# Patient Record
Sex: Female | Born: 1975 | Race: White | Hispanic: No | Marital: Married | Smoking: Never smoker
Health system: Southern US, Community
[De-identification: ages and names within clinical notes are randomized; demographics above are authoritative.]

## PROBLEM LIST (undated history)

## (undated) DIAGNOSIS — T7840XA Allergy, unspecified, initial encounter: Secondary | ICD-10-CM

## (undated) DIAGNOSIS — D649 Anemia, unspecified: Secondary | ICD-10-CM

## (undated) DIAGNOSIS — J45909 Unspecified asthma, uncomplicated: Secondary | ICD-10-CM

## (undated) DIAGNOSIS — H35042 Retinal micro-aneurysms, unspecified, left eye: Secondary | ICD-10-CM

## (undated) DIAGNOSIS — I10 Essential (primary) hypertension: Secondary | ICD-10-CM

## (undated) DIAGNOSIS — E282 Polycystic ovarian syndrome: Secondary | ICD-10-CM

## (undated) HISTORY — DX: Unspecified asthma, uncomplicated: J45.909

## (undated) HISTORY — PX: TUBAL LIGATION: SHX77

## (undated) HISTORY — PX: GALLBLADDER SURGERY: SHX652

## (undated) HISTORY — DX: Anemia, unspecified: D64.9

## (undated) HISTORY — DX: Retinal micro-aneurysms, unspecified, left eye: H35.042

## (undated) HISTORY — PX: CHOLECYSTECTOMY: SHX55

## (undated) HISTORY — DX: Essential (primary) hypertension: I10

## (undated) HISTORY — DX: Polycystic ovarian syndrome: E28.2

## (undated) HISTORY — PX: HERNIA REPAIR: SHX51

## (undated) HISTORY — DX: Allergy, unspecified, initial encounter: T78.40XA

---

## 2007-11-28 ENCOUNTER — Emergency Department (HOSPITAL_COMMUNITY): Admission: EM | Admit: 2007-11-28 | Discharge: 2007-11-28 | Payer: Self-pay | Admitting: Emergency Medicine

## 2008-02-03 ENCOUNTER — Emergency Department (HOSPITAL_COMMUNITY): Admission: EM | Admit: 2008-02-03 | Discharge: 2008-02-03 | Payer: Self-pay | Admitting: Emergency Medicine

## 2009-02-20 ENCOUNTER — Emergency Department (HOSPITAL_COMMUNITY): Admission: EM | Admit: 2009-02-20 | Discharge: 2009-02-20 | Payer: Self-pay | Admitting: Emergency Medicine

## 2011-08-18 DIAGNOSIS — H35042 Retinal micro-aneurysms, unspecified, left eye: Secondary | ICD-10-CM

## 2011-08-18 HISTORY — DX: Retinal micro-aneurysms, unspecified, left eye: H35.042

## 2011-08-19 ENCOUNTER — Ambulatory Visit (INDEPENDENT_AMBULATORY_CARE_PROVIDER_SITE_OTHER): Payer: BC Managed Care – PPO | Admitting: Emergency Medicine

## 2011-08-19 DIAGNOSIS — J329 Chronic sinusitis, unspecified: Secondary | ICD-10-CM

## 2011-08-19 DIAGNOSIS — E282 Polycystic ovarian syndrome: Secondary | ICD-10-CM

## 2011-08-19 DIAGNOSIS — I10 Essential (primary) hypertension: Secondary | ICD-10-CM

## 2011-08-19 DIAGNOSIS — E119 Type 2 diabetes mellitus without complications: Secondary | ICD-10-CM | POA: Insufficient documentation

## 2011-08-19 DIAGNOSIS — H35049 Retinal micro-aneurysms, unspecified, unspecified eye: Secondary | ICD-10-CM

## 2011-08-19 DIAGNOSIS — H35042 Retinal micro-aneurysms, unspecified, left eye: Secondary | ICD-10-CM

## 2011-08-19 LAB — COMPREHENSIVE METABOLIC PANEL
AST: 41 U/L — ABNORMAL HIGH (ref 0–37)
Alkaline Phosphatase: 99 U/L (ref 39–117)
BUN: 11 mg/dL (ref 6–23)
Calcium: 8.6 mg/dL (ref 8.4–10.5)
Creat: 0.58 mg/dL (ref 0.50–1.10)
Total Bilirubin: 0.3 mg/dL (ref 0.3–1.2)

## 2011-08-19 LAB — POCT CBC
Granulocyte percent: 68 %G (ref 37–80)
Hemoglobin: 11.7 g/dL — AB (ref 12.2–16.2)
MCV: 82.3 fL (ref 80–97)
MID (cbc): 0.5 (ref 0–0.9)
MPV: 8.3 fL (ref 0–99.8)
Platelet Count, POC: 313 10*3/uL (ref 142–424)
RBC: 4.62 M/uL (ref 4.04–5.48)

## 2011-08-19 LAB — LIPID PANEL
HDL: 59 mg/dL (ref >39)
LDL Cholesterol: 40 mg/dL (ref 0–99)
Total CHOL/HDL Ratio: 1.8 Ratio
Triglycerides: 36 mg/dL (ref <150)

## 2011-08-19 LAB — GLUCOSE, POCT (MANUAL RESULT ENTRY): POC Glucose: 95

## 2011-08-19 MED ORDER — LISINOPRIL 20 MG PO TABS: 20.0000 mg | ORAL_TABLET | Freq: Every day | ORAL | Status: DC

## 2011-08-19 MED ORDER — AZITHROMYCIN 250 MG PO TABS: ORAL_TABLET | ORAL | Status: AC

## 2011-08-19 MED ORDER — METFORMIN HCL ER 750 MG PO TB24: 750.0000 mg | ORAL_TABLET | Freq: Two times a day (BID) | ORAL | Status: DC

## 2011-08-19 MED ORDER — LISINOPRIL-HYDROCHLOROTHIAZIDE 20-12.5 MG PO TABS: 1.0000 | ORAL_TABLET | Freq: Every day | ORAL | Status: DC

## 2011-08-19 NOTE — Patient Instructions (Addendum)
I spoken with the ophthalmologist at [facility]  ophthalmology They advise she followup with the ophthalmologist first of the week. If she has sudden loss of vision she is to present to the emergency room for emergent evaluation. Sinusitis Sinuses are air pockets within the bones of your face. The growth of bacteria within a sinus leads to infection. The infection prevents the sinuses from draining. This infection is called sinusitis. SYMPTOMS   There will be different areas of pain depending on which sinuses have become infected.  The maxillary sinuses often produce pain beneath the eyes.     Frontal sinusitis may cause pain in the middle of the forehead and above the eyes.  Other problems (symptoms) include:  Toothaches.     Colored, pus-like (purulent) drainage from the nose.     Swelling, warmth, and tenderness over the sinus areas may be signs of infection.  TREATMENT   Sinusitis is most often determined by an exam.X-rays may be taken. If x-rays have been taken, make sure you obtain your results or find out how you are to obtain them. Your caregiver may give you medications (antibiotics). These are medications that will help kill the bacteria causing the infection. You may also be given a medication (decongestant) that helps to reduce sinus swelling.   HOME CARE INSTRUCTIONS    Only take over-the-counter or prescription medicines for pain, discomfort, or fever as directed by your caregiver.     Drink extra fluids. Fluids help thin the mucus so your sinuses can drain more easily.     Applying either moist heat or ice packs to the sinus areas may help relieve discomfort.     Use saline nasal sprays to help moisten your sinuses. The sprays can be found at your local drugstore.  SEEK IMMEDIATE MEDICAL CARE IF:  You have a fever.     You have increasing pain, severe headaches, or toothache.     You have nausea, vomiting, or drowsiness.     You develop unusual swelling around the  face or trouble seeing.  MAKE SURE YOU:    Understand these instructions.     Will watch your condition.     Will get help right away if you are not doing well or get worse.  Document Released: 06/26/2005 Document Revised: 03/08/2011 Document Reviewed: 01/23/2007 [facility] Patient Information 2012 [place], [place].

## 2011-08-19 NOTE — Progress Notes (Signed)
  Subjective:    Patient ID: April Duncan, female    DOB: 01/12/1976, 36 y.o.   MRN: [id number]  HPI patient presents with upper respiratory type. She's had no fever she does have a cough which has been productive of small amounts yellow phlegm. She denies running fever has no real flu symptoms or myalgia she does have facial pressure and discomfort.    Review of Systems patient states that she was seen yesterday by an optometrist. She is having discomfort with her vision in the left eye. Diagnosed with a retinal hemorrhage. She subsequently did be to be referred to a retinal specialist on Monday.     Objective:   Physical Exam  Constitutional: She is oriented to person, place, and time. She appears well-developed and well-nourished.  Eyes: Pupils are equal, round, and reactive to light.  Neck: Neck supple. No thyromegaly present.  Cardiovascular: Normal rate and regular rhythm.   Pulmonary/Chest: Effort normal and breath sounds normal.  Neurological: She is alert and oriented to person, place, and time.  Skin: Skin is warm and dry.   Examination of the left eye reveals a small retinal hemorrhage between the optic discs and macula. The margins are well-circumscribed the vessels leading to this area all appear normal.       Assessment & Plan:  Assessment is a left retinal hemorrhage with plans to see a retinal specialist the first of the week. Her problem on visit today is regarding a recent upper respiratory type infection with head congestion purulent nasal drainage and productive cough. She also has a history of diabetes and hypertension and needs followup of these 2  problems.

## 2011-08-25 ENCOUNTER — Other Ambulatory Visit: Payer: Self-pay | Admitting: Family Medicine

## 2011-09-27 ENCOUNTER — Other Ambulatory Visit: Payer: Self-pay | Admitting: Family Medicine

## 2011-11-03 ENCOUNTER — Ambulatory Visit (INDEPENDENT_AMBULATORY_CARE_PROVIDER_SITE_OTHER): Payer: BC Managed Care – PPO | Admitting: Emergency Medicine

## 2011-11-03 DIAGNOSIS — J45909 Unspecified asthma, uncomplicated: Secondary | ICD-10-CM

## 2011-11-03 DIAGNOSIS — E119 Type 2 diabetes mellitus without complications: Secondary | ICD-10-CM

## 2011-11-03 DIAGNOSIS — E232 Diabetes insipidus: Secondary | ICD-10-CM

## 2011-11-03 LAB — POCT GLYCOSYLATED HEMOGLOBIN (HGB A1C): Hemoglobin A1C: 6.2

## 2011-11-03 MED ORDER — ALBUTEROL SULFATE (2.5 MG/3ML) 0.083% IN NEBU
2.5000 mg | INHALATION_SOLUTION | Freq: Once | RESPIRATORY_TRACT | Status: AC
  Administered 2011-11-03: 2.5 mg via RESPIRATORY_TRACT

## 2011-11-03 MED ORDER — ALBUTEROL SULFATE HFA 108 (90 BASE) MCG/ACT IN AERS: 2.0000 | INHALATION_SPRAY | Freq: Four times a day (QID) | RESPIRATORY_TRACT | Status: DC | PRN

## 2011-11-03 MED ORDER — BECLOMETHASONE DIPROPIONATE 80 MCG/ACT IN AERS: 2.0000 | INHALATION_SPRAY | Freq: Two times a day (BID) | RESPIRATORY_TRACT | Status: DC

## 2011-11-03 NOTE — Progress Notes (Signed)
  Subjective:    Patient ID: April Duncan, female    DOB: Nov 04, 1975, 36 y.o.   MRN: [id number]  HPI enters for followup of diabetes mellitus. She also is having a significant flare of her asthma she normally is on Qvar and albuterol but has run out of these medications. She has been checking her sugars at home they've been variable running between the 103 100. She continues on her metformin    Review of Systems this is her typical time he did have trouble with her asthma. She's currently using her nasal inhalers patent anus and Nasonex but has run out of her to go and albuterol.     Objective:   Physical Exam  Constitutional: She appears well-developed and well-nourished.  HENT:  Head: Normocephalic.  Eyes: Pupils are equal, round, and reactive to light.  Neck: No JVD present. No tracheal deviation present. No thyromegaly present.  Cardiovascular: Normal rate, regular rhythm and normal heart sounds.   Pulmonary/Chest: No respiratory distress. She has no wheezes. She has no rales. She exhibits no tenderness.  Abdominal: She exhibits no distension. There is no tenderness. There is no rebound and no guarding.  Musculoskeletal: Normal range of motion.  Lymphadenopathy:    She has no cervical adenopathy.    Results for orders placed in visit on 11/03/11  GLUCOSE, POCT (MANUAL RESULT ENTRY)      Component Value Range   POC Glucose 118    POCT GLYCOSYLATED HEMOGLOBIN (HGB A1C)      Component Value Range   Hemoglobin A1C 6.2           Assessment & Plan:  Diabetes appears under pretty good control with hemoglobin A1c of 6.2. We'll go ahead and refill her Qvar and Ventolin.

## 2011-11-04 ENCOUNTER — Emergency Department (HOSPITAL_COMMUNITY): Payer: BC Managed Care – PPO

## 2011-11-04 ENCOUNTER — Emergency Department (HOSPITAL_COMMUNITY)
Admission: EM | Admit: 2011-11-04 | Discharge: 2011-11-04 | Disposition: A | Discharge status: Home / Self Care | Payer: BC Managed Care – PPO | Attending: Emergency Medicine | Admitting: Emergency Medicine

## 2011-11-04 ENCOUNTER — Encounter (HOSPITAL_COMMUNITY): Payer: Self-pay | Admitting: Emergency Medicine

## 2011-11-04 DIAGNOSIS — J3489 Other specified disorders of nose and nasal sinuses: Secondary | ICD-10-CM | POA: Insufficient documentation

## 2011-11-04 DIAGNOSIS — E119 Type 2 diabetes mellitus without complications: Secondary | ICD-10-CM | POA: Insufficient documentation

## 2011-11-04 DIAGNOSIS — R0602 Shortness of breath: Secondary | ICD-10-CM | POA: Insufficient documentation

## 2011-11-04 DIAGNOSIS — I10 Essential (primary) hypertension: Secondary | ICD-10-CM | POA: Insufficient documentation

## 2011-11-04 DIAGNOSIS — R05 Cough: Secondary | ICD-10-CM | POA: Insufficient documentation

## 2011-11-04 DIAGNOSIS — J45909 Unspecified asthma, uncomplicated: Secondary | ICD-10-CM | POA: Insufficient documentation

## 2011-11-04 DIAGNOSIS — Z79899 Other long term (current) drug therapy: Secondary | ICD-10-CM | POA: Insufficient documentation

## 2011-11-04 DIAGNOSIS — R059 Cough, unspecified: Secondary | ICD-10-CM | POA: Insufficient documentation

## 2011-11-04 LAB — POCT I-STAT, CHEM 8
Chloride: 106 mEq/L (ref 96–112)
Creatinine, Ser: 0.8 mg/dL (ref 0.50–1.10)
Glucose, Bld: 99 mg/dL (ref 70–99)
Potassium: 3.7 mEq/L (ref 3.5–5.1)

## 2011-11-04 LAB — URINALYSIS, ROUTINE W REFLEX MICROSCOPIC
Bilirubin Urine: NEGATIVE
Glucose, UA: NEGATIVE mg/dL
Hgb urine dipstick: NEGATIVE
Ketones, ur: NEGATIVE mg/dL
Nitrite: NEGATIVE
Protein, ur: NEGATIVE mg/dL
Specific Gravity, Urine: 1.012 (ref 1.005–1.030)
Urobilinogen, UA: 0.2 mg/dL (ref 0.0–1.0)
pH: 6.5 (ref 5.0–8.0)

## 2011-11-04 LAB — URINE MICROSCOPIC-ADD ON

## 2011-11-04 MED ORDER — TECHNETIUM TO 99M ALBUMIN AGGREGATED
3.3000 | Freq: Once | INTRAVENOUS | Status: AC | PRN
  Administered 2011-11-04: 3.3 via INTRAVENOUS

## 2011-11-04 MED ORDER — IOHEXOL 300 MG/ML  SOLN
100.0000 mL | Freq: Once | INTRAMUSCULAR | Status: AC | PRN
  Administered 2011-11-04: 100 mL via INTRAVENOUS

## 2011-11-04 NOTE — Discharge Instructions (Signed)
Please read and follow all provided instructions.  Your diagnoses today include:  1. Shortness of breath     Tests performed today include:  CT chest that could not tell if you had blood clots in your lungs  VQ scan that shows you do not have blood clots in your lungs  Vital signs. See below for your results today.   Medications prescribed:   None  Home care instructions:  Follow any educational materials contained in this packet.  Continue to use your albuterol inhaler for shortness of breath as prescribed by your doctor.   Follow-up instructions: Please follow-up with your primary care provider in the next 3 days for further evaluation of your symptoms. If you do not have a primary care doctor -- see below for referral information.   Return instructions:   Please return to the Emergency Department if you experience worsening symptoms.  Please return with worsening wheezing, shortness of breath, or difficulty breathing.  Return with persistent fever above 101F.   Please return if you have any other emergent concerns.  Additional Information:  Your vital signs today were: BP 130/69  Pulse 81  Temp(Src) 97.9 F (36.6 C) (Oral)  Resp 16  SpO2 97%  LMP 10/21/2011 If your blood pressure (BP) was elevated above 135/85 this visit, please have this repeated by your doctor within one month. -------------- No Primary Care Doctor Call [facility]  [phone] Other agencies that provide inexpensive medical care    [facility]  [phone]    [facility]  [phone]    [facility]  [phone]    [facility]  [phone]    [facility]  [phone]    [facility]  [phone] -------------- RESOURCE GUIDE:  Dental Problems  Patients with Medicaid: [facility] [phone] [address]                                            [phone] [address] [employer] [address] Phone:  [phone]                                                    Phone:  [phone]  If unable to pay or uninsured, contact:  [facility] or [facility]. to become qualified for the adult dental clinic.  Chronic Pain Problems Contact [facility]  [phone] Patients need to be referred by their primary care doctor.  Insufficient Money for Medicine Contact [employer]:  call "211" or [facility] [phone].  Psychological Services [facility]  [phone] [facility]  [phone] [facility] Mental [facility]   [phone] (emergency services [phone])  Substance Abuse Resources [facility]  9168723166 [facility] [phone] [address] [place] [phone] Floydene Flock [phone] Residential & Outpatient Substance Abuse Program  [phone]  Abuse/Neglect [facility] Child Abuse Hotline (216)068-4702 [facility] Child Abuse Hotline 520-037-2128 (After Hours)  Emergency Shelter [facility] [phone]  Maternity Homes Room at the [place] [facility] [place] 6412640784 [facility] Services [phone]  [place]  [employer]  [facility]     [employer]                          [facility] Dept. [address] [place]                       [address]      [address]  Blondell Reveal Phone:  [phone]                                   Phone:  [phone]                 Phone:  [phone]  [facility] Phone:  [phone]  [facility] Child Abuse Hotline [phone] (After Hours)

## 2011-11-04 NOTE — ED Notes (Signed)
Patient is alert and oriented x3.  She was given DC instructions and follow up visit instructions.  Patient gave verbal understanding. She was DC ambulatory under his own power to home.  V/S stable.  He was not showing any signs of distress on DC 

## 2011-11-04 NOTE — ED Notes (Signed)
EKG Performed on night shift at 1:01 am 11/04/11.

## 2011-11-04 NOTE — ED Notes (Signed)
Pt co Shortness of breath beginning yesterday, seen at PCP yesterday and given a neb tx and ordered 2 inhalers (not picked up yet), pt awaken 15 min ago with shortness of breath, cough with thick yellow mucus, pt also states she has some chest tightness and nausea.

## 2011-11-04 NOTE — ED Provider Notes (Signed)
Medical screening examination/treatment/procedure(s) were conducted as a shared visit with non-physician practitioner(s) and myself.  I personally evaluated the patient during the encounter  Please see my separate respective documentation pertaining to this patient encounter   Vida Roller, MD 11/04/11 [phone]

## 2011-11-04 NOTE — ED Provider Notes (Signed)
History     CSN: [id number]  Arrival date & time 11/04/11  0041   First MD Initiated Contact with Patient 11/04/11 0053      Chief Complaint  Patient presents with  . Asthma     Patient is a 36 y.o. female presenting with shortness of breath. The history is provided by the patient.  Shortness of Breath  The current episode started more than 1 week ago. The onset was sudden. The problem occurs frequently. The problem has been gradually worsening. The problem is moderate. The symptoms are relieved by beta-agonist inhalers. The symptoms are aggravated by activity and a supine position. Associated symptoms include rhinorrhea, cough and shortness of breath. Pertinent negatives include no chest pain, no chest pressure, no fever, no sore throat and no wheezing. Her past medical history is significant for asthma. She has been behaving normally.  Pt reports increasing SOB over the last 2 weeks. States symptoms began on a return trip to [place] approx 2 weeks ago (by car). States it strted as mild cold symptoms that worsened and persisted in spite of multiple OTC and Rx products she has used to palliate symptoms.  States she has been using her inhalers more frequently w/ only brief relief. States she becomes very SOB w/ activity. Admits to increased productive cough but no fever. Denies CP but states her chest is "sore" from all the coughing and SOB. Went to [facility] this past evening and was given a breathing tx which helped some. She attempted to lie down for bed and became acutely SOB and felt as if she could not breathe. Denies recent pain, redness or swelling in either LE or any past hx of DVT. No family hx of DVT.   Past Medical History  Diagnosis Date  . 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 Diabetes mellitus   . Hypertension   . PCOS (polycystic ovarian syndrome)     History reviewed. No pertinent past surgical history.  No family history on file.  History  Substance  Use Topics  . Smoking status: Never Smoker   . Smokeless tobacco: Not on file  . Alcohol Use: Not on file    OB History    Grav Para Term Preterm Abortions TAB SAB Ect Mult Living                  Review of Systems  Constitutional: Negative.  Negative for fever and chills.  HENT: Positive for congestion and rhinorrhea. Negative for sore throat and sneezing.   Respiratory: Positive for cough and shortness of breath. Negative for chest tightness and wheezing.   Cardiovascular: Negative for chest pain.  Gastrointestinal: Negative.   Genitourinary: Negative.   Musculoskeletal: Negative.   Neurological: Negative.   Hematological: Negative.   Psychiatric/Behavioral: Negative.     Allergies  Review of patient's allergies indicates no known allergies.  Home Medications   Current Outpatient Rx  Name Route Sig Dispense Refill  . ALBUTEROL SULFATE HFA 108 (90 BASE) MCG/ACT IN AERS Inhalation Inhale 2 puffs into the lungs every 6 (six) hours as needed for wheezing. 1 Inhaler 4  . BECLOMETHASONE DIPROPIONATE 80 MCG/ACT IN AERS Inhalation Inhale 2 puffs into the lungs 2 (two) times daily. 1 Inhaler 12  . FEXOFENADINE HCL 180 MG PO TABS Oral Take 180 mg by mouth daily.    Marland Kitchen LISINOPRIL 20 MG PO TABS Oral Take 1 tablet (20 mg total) by mouth daily. 30 tablet 12  . LISINOPRIL-HYDROCHLOROTHIAZIDE 20-12.5  MG PO TABS Oral Take 1 tablet by mouth daily. 30 tablet 12  . METFORMIN HCL ER 750 MG PO TB24 Oral Take 1 tablet (750 mg total) by mouth 2 (two) times daily. 60 tablet 12    BP 155/94  Pulse 86  Temp(Src) 98.7 F (37.1 C) (Oral)  SpO2 97%  Physical Exam  Constitutional: She is oriented to person, place, and time. She appears well-developed and well-nourished.  HENT:  Head: Normocephalic and atraumatic.  Eyes: Conjunctivae are normal.  Neck: Neck supple.  Cardiovascular: Normal rate and regular rhythm.   Pulmonary/Chest: Effort normal and breath sounds normal.  Abdominal: Soft.  Bowel sounds are normal.  Musculoskeletal: Normal range of motion.  Neurological: She is alert and oriented to person, place, and time.  Skin: Skin is warm and dry.  Psychiatric: She has a normal mood and affect.    ED Course  Procedures   Date: 11/04/2011  Rate: 83  Rhythm: sinus rhythm  QRS Axis: [place] (L) axis deviation  Intervals: normal  ST/T Wave abnormalities: normal  Conduction Disutrbances:none  Narrative Interpretation:   Old EKG Reviewed: none available  Ct chest angio notes suboptimal bolus timing so unable to r/o PE. Dr Hyacinth Meeker and I both have reviewed the Ct scan. Decision made to hold pt for VQ scan in am. I have discussed this plan w/ pt who is agreeable. Pt continues to rest in NAD.     Labs Reviewed  URINALYSIS, ROUTINE W REFLEX MICROSCOPIC   No results found.   No diagnosis found.    MDM  HPI and PE worrisome for PE given persistent SOB s/p recent car travel to and from [place] when sx's began. Ct chest angio suboptimal study so unable to r/o. V/Q scan pending. Other differentials include  asthmatic bronchitis vs asthma flare         Leanne Chang, NP 11/04/11 [phone]

## 2011-11-04 NOTE — ED Notes (Signed)
36 year old obese female presents with intermittent coughing spells and shortness of breath. She states that since returning from a [place] vacation where she drove approximately 10 hours each direction she has had worsening cough and shortness of breath. She does have a history of asthma and allergies and has been taking Allegra daily, nasal spray for nasal congestion as well as albuterol inhalers at home. She states that though this does help somewhat she continues to have severe coughing fits which later feeling like she can't breathe. Currently she feels well and does not have any shortness of breath. She did have some associated chest pain earlier in the evening. She denies trauma, swelling but does have a history of immobilization and obesity.  Physical exam:  Obese, well-appearing, normal heart rate, clear lungs without wheezing rales or increased work of breathing, oropharynx is clear, extremities without edema or asymmetry.  Assessment:  EKG without acute findings, rule out pulmonary embolism the CT scan otherwise well appearing and anticipate discharge home. Possible allergies as inciting event for coughing fits.  Medical screening examination/treatment/procedure(s) were conducted as a shared visit with non-physician practitioner(s) and myself.  I personally evaluated the patient during the encounter   Vida Roller, MD 11/04/11 [phone]

## 2011-11-04 NOTE — ED Provider Notes (Signed)
Medical screening examination/treatment/procedure(s) were conducted as a shared visit with non-physician practitioner(s) and myself.  I personally evaluated the patient during the encounter  Please see my separate respective documentation pertaining to this patient encounter   Caree Wolpert D Juliany Daughety, MD 11/04/11 2317 

## 2011-11-04 NOTE — ED Notes (Signed)
Pt c/o asthma exacerbation. Seen earlier yesterday at urgent care and received neb tx. Pt states just before coming in she felt like she "couldn't get any air in".

## 2011-11-04 NOTE — ED Provider Notes (Signed)
History     CSN: [id number]  Arrival date & time 11/04/11  0041   First MD Initiated Contact with Patient 11/04/11 0053      Chief Complaint  Patient presents with  . Asthma    (Consider location/radiation/quality/duration/timing/severity/associated sxs/prior treatment) HPI  Past Medical History  Diagnosis Date  . 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 Diabetes mellitus   . Hypertension   . PCOS (polycystic ovarian syndrome)     History reviewed. No pertinent past surgical history.  No family history on file.  History  Substance Use Topics  . Smoking status: Never Smoker   . Smokeless tobacco: Not on file  . Alcohol Use: Not on file    OB History    Grav Para Term Preterm Abortions TAB SAB Ect Mult Living                  Review of Systems  Allergies  Review of patient's allergies indicates no known allergies.  Home Medications   Current Outpatient Rx  Name Route Sig Dispense Refill  . ALBUTEROL SULFATE HFA 108 (90 BASE) MCG/ACT IN AERS Inhalation Inhale 2 puffs into the lungs every 6 (six) hours as needed for wheezing. 1 Inhaler 4  . BECLOMETHASONE DIPROPIONATE 80 MCG/ACT IN AERS Inhalation Inhale 2 puffs into the lungs 2 (two) times daily. 1 Inhaler 12  . FEXOFENADINE HCL 180 MG PO TABS Oral Take 180 mg by mouth daily.    Marland Kitchen LISINOPRIL 20 MG PO TABS Oral Take 1 tablet (20 mg total) by mouth daily. 30 tablet 12  . LISINOPRIL-HYDROCHLOROTHIAZIDE 20-12.5 MG PO TABS Oral Take 1 tablet by mouth daily. 30 tablet 12  . METFORMIN HCL ER 750 MG PO TB24 Oral Take 1 tablet (750 mg total) by mouth 2 (two) times daily. 60 tablet 12    BP 130/69  Pulse 81  Temp(Src) 97.9 F (36.6 C) (Oral)  Resp 16  SpO2 97%  Physical Exam  ED Course  Procedures (including critical care time)  Labs Reviewed  URINALYSIS, ROUTINE W REFLEX MICROSCOPIC - Abnormal; Notable for the following:    APPearance CLOUDY (*)    Leukocytes, UA MODERATE (*)    All other  components within normal limits  URINE MICROSCOPIC-ADD ON - Abnormal; Notable for the following:    Squamous Epithelial / LPF FEW (*)    Bacteria, UA MANY (*)    All other components within normal limits  POCT I-STAT, CHEM 8   Ct Angio Chest W/cm &/or Wo Cm  11/04/2011  *RADIOLOGY REPORT*  Clinical Data: Cough, shortness of breath, recent travel.  CT ANGIOGRAPHY CHEST  Technique:  Multidetector CT imaging of the chest using the standard protocol during bolus administration of intravenous contrast. Multiplanar reconstructed images including MIPs were obtained and reviewed to evaluate the vascular anatomy.  Contrast: OMNIPAQUE IOHEXOL 300 MG/ML  SOLN  Comparison: None.  Findings: Suboptimal contrast bolus timing.  No main branch pulmonary arterial filling defect.  The more peripheral branches are suboptimally evaluated.  Normal caliber aorta.  Normal heart size.  No pleural or pericardial effusion.  No intrathoracic lymphadenopathy.  Patent central airways. Mild opacity within the left upper lobe anteriorly.  Otherwise, lungs are clear with exception of mild dependent atelectasis. No pneumothorax.  Limited images through the upper abdomen demonstrate hepatic steatosis.  No focal abnormality.  No acute osseous abnormality.  Multilevel degenerative changes.  IMPRESSION: Suboptimal contrast bolus.  No main branch pulmonary arterial filling defect.  The  more peripheral branches are inadequately evaluated.  Minimal left upper lobe opacity may reflect atelectasis or mild infiltrate.  Hepatic steatosis.  Original Report Authenticated By: Waneta Martins, M.D.   Nm Pulmonary Per & Vent  11/04/2011  *RADIOLOGY REPORT*  Clinical Data:  Concern pulmonary embolism.  Short of breath. Chest pain.  Suboptimal CTA  NUCLEAR MEDICINE VENTILATION - PERFUSION LUNG SCAN  Technique:    Perfusion images were obtained in multiple projections after intravenous injection of Tc-28m MAA.  Radiopharmaceuticals:  3.3 mCi Tc-14m  MAA.  Comparison: CT 11/04/2011  Findings: .  Perfusion:  No wedge shaped peripheral perfusion defects to suggest acute pulmonary embolism  IMPRESSION:  No evidence for acute pulmonary embolism.  Original Report Authenticated By: Genevive Bi, M.D.     1. Shortness of breath     6:38 AM Handoff from Schorr NP at 0600. Patient with recent travel, SOB -- worsening SOB last night. Had CT angio chest to look for PE and this was suboptimal. Patient has not been tachycardic, tachypneic, hypoxic. Pending: VQ scan. Plan: d/c to home if low prob, arrange PCP f/u if indeterminate.    Vital signs reviewed and are as follows: Filed Vitals:   11/04/11 0624  BP: 130/69  Pulse: 81  Temp: 97.9 F (36.6 C)  Resp: 16   9:02 AM VQ scan reviewed by myself. Normal per radiology. Pt informed. Patient examined by myself -- breath sounds normal, no wheezing, no tachypnea. No tachycardia. Patient states she is feeling well. She is requesting discharge to home. She will follow-up with Dr. Cleta Alberts next week. I urged her to continue using home albuterol inhaler as needed.   Patient urged to return with worsening shortness of breath, chest pain, fever, or other concerns. She verbalizes understanding and agrees with plan.  MDM  Shortness of breath, VQ scan is normal. Patient appears well at discharge. No respiratory distress        Renne Crigler, [place] 11/04/11 [phone]

## 2012-05-21 ENCOUNTER — Ambulatory Visit (INDEPENDENT_AMBULATORY_CARE_PROVIDER_SITE_OTHER): Payer: BC Managed Care – PPO | Admitting: Family Medicine

## 2012-05-21 VITALS — BP 142/92 | HR 80 | Temp 97.9°F | Resp 18 | Ht 63.5 in | Wt 284.0 lb

## 2012-05-21 DIAGNOSIS — R5381 Other malaise: Secondary | ICD-10-CM

## 2012-05-21 DIAGNOSIS — N920 Excessive and frequent menstruation with regular cycle: Secondary | ICD-10-CM

## 2012-05-21 LAB — POCT UA - MICROSCOPIC ONLY
Casts, Ur, LPF, POC: NEGATIVE
Crystals, Ur, HPF, POC: NEGATIVE
Mucus, UA: NEGATIVE
Yeast, UA: NEGATIVE

## 2012-05-21 LAB — POCT URINALYSIS DIPSTICK
Bilirubin, UA: NEGATIVE
Glucose, UA: NEGATIVE
Nitrite, UA: NEGATIVE
Spec Grav, UA: 1.025
Urobilinogen, UA: 1
pH, UA: 6

## 2012-05-21 LAB — POCT CBC
Granulocyte percent: 71.4 %G (ref 37–80)
HCT, POC: 39.4 % (ref 37.7–47.9)
Hemoglobin: 11.7 g/dL — AB (ref 12.2–16.2)
Lymph, poc: 2.6 (ref 0.6–3.4)
MCH, POC: 24.5 pg — AB (ref 27–31.2)
MCHC: 29.7 g/dL — AB (ref 31.8–35.4)
MCV: 82.7 fL (ref 80–97)
MID (cbc): 0.5 (ref 0–0.9)
MPV: 7.7 fL (ref 0–99.8)
POC Granulocyte: 7.7 — AB (ref 2–6.9)
POC LYMPH PERCENT: 24 %L (ref 10–50)
POC MID %: 4.6 %M (ref 0–12)
Platelet Count, POC: 427 10*3/uL — AB (ref 142–424)
RBC: 4.77 M/uL (ref 4.04–5.48)
RDW, POC: 17.3 %
WBC: 10.8 10*3/uL — AB (ref 4.6–10.2)

## 2012-05-21 LAB — POCT URINE PREGNANCY: Preg Test, Ur: NEGATIVE

## 2012-05-21 NOTE — Patient Instructions (Addendum)

## 2012-05-21 NOTE — Progress Notes (Signed)
36 yo woman S/P BTL who started her last period October 26 which was late.  She was changing pads qh until 1 week ago, but then started bleeding again yesterday.  She went to gynecologist yesterday who told her that the pelvic was normal and she was put on Provera.  Objective:  Tearful and afraid Abdomen:  Soft, nontender, obese  Results for orders placed in visit on 05/21/12  POCT CBC      Component Value Range   WBC 10.8 (*) 4.6 - 10.2 K/uL   Lymph, poc 2.6  0.6 - 3.4   POC LYMPH PERCENT 24.0  10 - 50 %L   MID (cbc) 0.5  0 - 0.9   POC MID % 4.6  0 - 12 %M   POC Granulocyte 7.7 (*) 2 - 6.9   Granulocyte percent 71.4  37 - 80 %G   RBC 4.77  4.04 - 5.48 M/uL   Hemoglobin 11.7 (*) 12.2 - 16.2 g/dL   HCT, POC 16.1  09.6 - 47.9 %   MCV 82.7  80 - 97 fL   MCH, POC 24.5 (*) 27 - 31.2 pg   MCHC 29.7 (*) 31.8 - 35.4 g/dL   RDW, POC 04.5     Platelet Count, POC 427 (*) 142 - 424 K/uL   MPV 7.7  0 - 99.8 fL  POCT URINE PREGNANCY      Component Value Range   Preg Test, Ur Negative    POCT URINALYSIS DIPSTICK      Component Value Range   Color, UA red     Clarity, UA cloudy     Glucose, UA neg     Bilirubin, UA neg     Ketones, UA trace     Spec Grav, UA 1.025     Blood, UA large     pH, UA 6.0     Protein, UA 100mg      Urobilinogen, UA 1.0     Nitrite, UA neg     Leukocytes, UA Trace    POCT UA - MICROSCOPIC ONLY      Component Value Range   WBC, Ur, HPF, POC 0-5     RBC, urine, microscopic tntc     Bacteria, U Microscopic trace     Mucus, UA neg     Epithelial cells, urine per micros 0-2     Crystals, Ur, HPF, POC neg     Casts, Ur, LPF, POC neg     Yeast, UA neg     A: anemia with menorrhagia  P:  Stay on Provera Start Iron Follow up one month

## 2012-05-22 LAB — FERRITIN: Ferritin: 8 ng/mL — ABNORMAL LOW (ref 10–291)

## 2012-05-22 LAB — TSH: TSH: 1.859 u[IU]/mL (ref 0.350–4.500)

## 2012-06-30 ENCOUNTER — Ambulatory Visit (INDEPENDENT_AMBULATORY_CARE_PROVIDER_SITE_OTHER): Payer: BC Managed Care – PPO | Admitting: Family Medicine

## 2012-06-30 VITALS — BP 132/92 | HR 97 | Temp 98.1°F | Resp 18 | Ht 64.0 in | Wt 280.0 lb

## 2012-06-30 DIAGNOSIS — M79671 Pain in right foot: Secondary | ICD-10-CM

## 2012-06-30 DIAGNOSIS — R531 Weakness: Secondary | ICD-10-CM

## 2012-06-30 DIAGNOSIS — R58 Hemorrhage, not elsewhere classified: Secondary | ICD-10-CM

## 2012-06-30 DIAGNOSIS — R1013 Epigastric pain: Secondary | ICD-10-CM

## 2012-06-30 DIAGNOSIS — M79672 Pain in left foot: Secondary | ICD-10-CM

## 2012-06-30 LAB — POCT CBC
Granulocyte percent: 67.5 %G (ref 37–80)
HCT, POC: 42.6 % (ref 37.7–47.9)
Hemoglobin: 13 g/dL (ref 12.2–16.2)
Lymph, poc: 1.5 (ref 0.6–3.4)
MCH, POC: 25.9 pg — AB (ref 27–31.2)
MCHC: 30.5 g/dL — AB (ref 31.8–35.4)
MCV: 84.9 fL (ref 80–97)
MID (cbc): 0.3 (ref 0–0.9)
MPV: 8.1 fL (ref 0–99.8)
POC Granulocyte: 3.8 (ref 2–6.9)
POC LYMPH PERCENT: 27 %L (ref 10–50)
POC MID %: 5.5 %M (ref 0–12)
Platelet Count, POC: 348 10*3/uL (ref 142–424)
RBC: 5.02 M/uL (ref 4.04–5.48)
RDW, POC: 18.4 %
WBC: 5.7 10*3/uL (ref 4.6–10.2)

## 2012-06-30 LAB — IFOBT (OCCULT BLOOD): IFOBT: POSITIVE

## 2012-06-30 MED ORDER — MELOXICAM 7.5 MG PO TABS: 7.5000 mg | ORAL_TABLET | Freq: Every day | ORAL | Status: DC

## 2012-06-30 MED ORDER — OMEPRAZOLE 20 MG PO CPDR: 20.0000 mg | DELAYED_RELEASE_CAPSULE | Freq: Every day | ORAL | Status: DC

## 2012-06-30 NOTE — Progress Notes (Signed)
36 yo woman S/P BTL who started her last period October 26 which was late. She was changing pads qh until 6 weeks ago when she started 10 days of Provera, but then started bleeding again when she finished the Provera. (She went to gynecologist1 month ago who told her that the pelvic was normal and she was put on Provera-[facility].)  She has been taking iron but the fatigue is persisting.  Patient has undergone hernia repair as well  Patient complains of upper abdominal as well as some lower abdominal pain.  The latter she attributes to past hernia repair.  She cannot tell whether blood is coming from vagina or rectum.  Also,  Patient complains of left heel swelling and discomfort walking for the last month.  Objective:  NAD Obese.  Mildly tender epigastrium and hypogastrium in midline. Vagina:  Blood scattered throughout vagina coming from os slowly Rectal:  No hemorrhoids or masses. Results for orders placed in visit on 06/30/12  POCT CBC      Component Value Range   WBC 5.7  4.6 - 10.2 K/uL   Lymph, poc 1.5  0.6 - 3.4   POC LYMPH PERCENT 27.0  10 - 50 %L   MID (cbc) 0.3  0 - 0.9   POC MID % 5.5  0 - 12 %M   POC Granulocyte 3.8  2 - 6.9   Granulocyte percent 67.5  37 - 80 %G   RBC 5.02  4.04 - 5.48 M/uL   Hemoglobin 13.0  12.2 - 16.2 g/dL   HCT, POC 16.1  09.6 - 47.9 %   MCV 84.9  80 - 97 fL   MCH, POC 25.9 (*) 27 - 31.2 pg   MCHC 30.5 (*) 31.8 - 35.4 g/dL   RDW, POC 04.5     Platelet Count, POC 348  142 - 424 K/uL   MPV 8.1  0 - 99.8 fL  hard swelling left achilles insertion area without tenderness or fluctuance.  No erythema  Assessment:  DUB secondary to obesity, epigastric pains  Plan: restart the Provera 10 mg each night, see gynecologist on Dec 26th as scheduled Prilosec for epigastric pain

## 2012-06-30 NOTE — Patient Instructions (Addendum)
Keep appointment with [facility] on December 26th Start Provera 10 mg each night Start Prilosec for the upper abdominal pain  Results for orders placed in visit on 06/30/12  POCT CBC      Component Value Range   WBC 5.7  4.6 - 10.2 K/uL   Lymph, poc 1.5  0.6 - 3.4   POC LYMPH PERCENT 27.0  10 - 50 %L   MID (cbc) 0.3  0 - 0.9   POC MID % 5.5  0 - 12 %M   POC Granulocyte 3.8  2 - 6.9   Granulocyte percent 67.5  37 - 80 %G   RBC 5.02  4.04 - 5.48 M/uL   Hemoglobin 13.0  12.2 - 16.2 g/dL   HCT, POC 69.6  29.5 - 47.9 %   MCV 84.9  80 - 97 fL   MCH, POC 25.9 (*) 27 - 31.2 pg   MCHC 30.5 (*) 31.8 - 35.4 g/dL   RDW, POC 28.4     Platelet Count, POC 348  142 - 424 K/uL   MPV 8.1  0 - 99.8 fL

## 2012-06-30 NOTE — Addendum Note (Signed)
Addended by: Thelma Barge D on: 06/30/2012 03:02 PM   Modules accepted: Orders

## 2012-07-01 LAB — H. PYLORI ANTIBODY, IGG: H Pylori IgG: 0.53 {ISR}

## 2012-09-12 ENCOUNTER — Other Ambulatory Visit: Payer: Self-pay | Admitting: Emergency Medicine

## 2012-10-07 ENCOUNTER — Ambulatory Visit (INDEPENDENT_AMBULATORY_CARE_PROVIDER_SITE_OTHER): Payer: BC Managed Care – PPO | Admitting: Family Medicine

## 2012-10-07 VITALS — BP 157/111 | HR 89 | Temp 98.3°F | Resp 18 | Ht 64.0 in | Wt 281.4 lb

## 2012-10-07 DIAGNOSIS — J45909 Unspecified asthma, uncomplicated: Secondary | ICD-10-CM

## 2012-10-07 DIAGNOSIS — J329 Chronic sinusitis, unspecified: Secondary | ICD-10-CM

## 2012-10-07 DIAGNOSIS — J209 Acute bronchitis, unspecified: Secondary | ICD-10-CM

## 2012-10-07 DIAGNOSIS — I10 Essential (primary) hypertension: Secondary | ICD-10-CM

## 2012-10-07 DIAGNOSIS — M7661 Achilles tendinitis, right leg: Secondary | ICD-10-CM

## 2012-10-07 DIAGNOSIS — M7660 Achilles tendinitis, unspecified leg: Secondary | ICD-10-CM

## 2012-10-07 MED ORDER — HYDROCODONE-HOMATROPINE 5-1.5 MG/5ML PO SYRP
5.0000 mL | ORAL_SOLUTION | Freq: Three times a day (TID) | ORAL | Status: DC | PRN
Start: 2012-10-07 — End: 2012-12-03

## 2012-10-07 MED ORDER — DICLOFENAC SODIUM 1 % TD GEL: 2.0000 g | Freq: Three times a day (TID) | TRANSDERMAL | Status: DC

## 2012-10-07 MED ORDER — AZITHROMYCIN 250 MG PO TABS: ORAL_TABLET | ORAL | Status: DC

## 2012-10-07 MED ORDER — ALBUTEROL SULFATE HFA 108 (90 BASE) MCG/ACT IN AERS: 2.0000 | INHALATION_SPRAY | Freq: Four times a day (QID) | RESPIRATORY_TRACT | Status: DC | PRN

## 2012-10-07 MED ORDER — LISINOPRIL-HYDROCHLOROTHIAZIDE 20-12.5 MG PO TABS: 1.0000 | ORAL_TABLET | Freq: Every day | ORAL | Status: DC

## 2012-10-07 MED ORDER — BECLOMETHASONE DIPROPIONATE 80 MCG/ACT IN AERS: 2.0000 | INHALATION_SPRAY | Freq: Two times a day (BID) | RESPIRATORY_TRACT | Status: DC

## 2012-10-07 MED ORDER — METOPROLOL SUCCINATE ER 100 MG PO TB24
100.0000 mg | ORAL_TABLET | Freq: Every day | ORAL | Status: DC
Start: 2012-10-07 — End: 2013-07-31

## 2012-10-07 NOTE — Progress Notes (Signed)
  Subjective:    Patient ID: April Duncan, female    DOB: 1976-02-14, 37 y.o.   MRN: [id number]  HPI Patient comes in today with complaints of a cough and chest congestion for the past three days. Her nose has been stuffy. A sore throat has been bothering her for the past week or so. Patient has a history of asthma and has an inhaler. She has been using it with no relief. She has been coughing a lot at night. She has been taking OTC's with decongestants.   She has been feeling her heart has been racing and is concerned that her pressure is up.   Right Foot has a large bump on it causing pain for about a month. She is wearing orthotic type shoes.   Review of Systems No fever or significant shortness of breath    Objective:   Physical Exam  BP retaken during exam 150/116 HEENT:  Normal Chest:  Few wheezes Heart:  Rate about 100, regular wihtout gallop or murmur Ext: swollen insertion of achilles right heel with mild tenderness      Assessment & Plan:  Hypertension with morbid obesity:  Will add Metoprolol and counseled patient on weight. Recheck 1 month Achilles tendonitis:  voltaren gel tid Bronchitis:  z pak, continue inhaler, hydromet  Accelerated hypertension - Plan: metoprolol succinate (TOPROL-XL) 100 MG 24 hr tablet, lisinopril-hydrochlorothiazide (PRINZIDE,ZESTORETIC) 20-12.5 MG per tablet  Acute bronchitis - Plan: azithromycin (ZITHROMAX Z-PAK) 250 MG tablet, HYDROcodone-homatropine (HYCODAN) 5-1.5 MG/5ML syrup  Achilles tendinitis of right lower extremity - Plan: diclofenac sodium (VOLTAREN) 1 % GEL  Asthma - Plan: albuterol (PROVENTIL HFA;VENTOLIN HFA) 108 (90 BASE) MCG/ACT inhaler, beclomethasone (QVAR) 80 MCG/ACT inhaler

## 2012-10-07 NOTE — Patient Instructions (Signed)
Achilles Tendinitis Tendinitis a swelling and soreness of the tendon. The pain in the tendon (cord-like structure which attaches muscle to bone) is produced by tiny tears and the inflammation present in that tendon. It commonly occurs at the shoulders, heels, and elbows. It is usually caused by overusing the tendon and joint involved. Achilles tendinitis involves the Achilles tendon. This is the large tendon in the back of the leg just above the foot. It attaches the large muscles of the lower leg to the heel bone (called calcaneus).  This diagnosis (learning what is wrong) is made by examination. X-rays will be generally be normal if only tendinitis is present. HOME CARE INSTRUCTIONS   Apply ice to the injury for 15 to 20 minutes, 3 to 4 times per day. Put the ice in a plastic bag and place a towel between the bag of ice and your skin.  Try to avoid use other than gentle range of motion while the tendon is painful. Do not resume use until instructed by your caregiver. Then begin use gradually. Do not increase use to the point of pain. If pain does develop, decrease use and continue the above measures. Gradually increase activities that do not cause discomfort until you gradually achieve normal use.  Only take over-the-counter or prescription medicines for pain, discomfort, or fever as directed by your caregiver. SEEK MEDICAL CARE IF:   Your pain and swelling increase or pain is uncontrolled with medications.  You develop new, unexplained problems (symptoms) or an increase of the symptoms that brought you to your caregiver.  You develop an inability to move your toes or foot, develop warmth and swelling in your foot, or begin running an unexplained temperature. MAKE SURE YOU:   Understand these instructions.  Will watch your condition.  Will get help right away if you are not doing well or get worse. Document Released: 04/05/2005 Document Revised: 09/18/2011 Document Reviewed:  02/12/2008 [facility] Patient Information 2013 [place], [place]. Hypertension As your heart beats, it forces blood through your arteries. This force is your blood pressure. If the pressure is too high, it is called hypertension (HTN) or high blood pressure. HTN is dangerous because you may have it and not know it. High blood pressure may mean that your heart has to work harder to pump blood. Your arteries may be narrow or stiff. The extra work puts you at risk for heart disease, stroke, and other problems.  Blood pressure consists of two numbers, a higher number over a lower, 110/72, for example. It is stated as "110 over 72." The ideal is below 120 for the top number (systolic) and under 80 for the bottom (diastolic). Write down your blood pressure today. You should pay close attention to your blood pressure if you have certain conditions such as:  Heart failure.  Prior heart attack.  Diabetes  Chronic kidney disease.  Prior stroke.  Multiple risk factors for heart disease. To see if you have HTN, your blood pressure should be measured while you are seated with your arm held at the level of the heart. It should be measured at least twice. A one-time elevated blood pressure reading (especially in the Emergency Department) does not mean that you need treatment. There may be conditions in which the blood pressure is different between your right and left arms. It is important to see your caregiver soon for a recheck. Most people have essential hypertension which means that there is not a specific cause. This type of high blood pressure  may be lowered by changing lifestyle factors such as:  Stress.  Smoking.  Lack of exercise.  Excessive weight.  Drug/tobacco/alcohol use.  Eating less salt. Most people do not have symptoms from high blood pressure until it has caused damage to the body. Effective treatment can often prevent, delay or reduce that damage. TREATMENT  When a cause has been  identified, treatment for high blood pressure is directed at the cause. There are a large number of medications to treat HTN. These fall into several categories, and your caregiver will help you select the medicines that are best for you. Medications may have side effects. You should review side effects with your caregiver. If your blood pressure stays high after you have made lifestyle changes or started on medicines,   Your medication(s) may need to be changed.  Other problems may need to be addressed.  Be certain you understand your prescriptions, and know how and when to take your medicine.  Be sure to follow up with your caregiver within the time frame advised (usually within two weeks) to have your blood pressure rechecked and to review your medications.  If you are taking more than one medicine to lower your blood pressure, make sure you know how and at what times they should be taken. Taking two medicines at the same time can result in blood pressure that is too low. SEEK IMMEDIATE MEDICAL CARE IF:  You develop a severe headache, blurred or changing vision, or confusion.  You have unusual weakness or numbness, or a faint feeling.  You have severe chest or abdominal pain, vomiting, or breathing problems. MAKE SURE YOU:   Understand these instructions.  Will watch your condition.  Will get help right away if you are not doing well or get worse. Document Released: 06/26/2005 Document Revised: 09/18/2011 Document Reviewed: 02/14/2008 [facility] Patient Information 2013 [place], [place]. Bronchitis Bronchitis is the body's way of reacting to injury and/or infection (inflammation) of the bronchi. Bronchi are the air tubes that extend from the windpipe into the lungs. If the inflammation becomes severe, it may cause shortness of breath. CAUSES  Inflammation may be caused by:  A virus.  Germs (bacteria).  Dust.  Allergens.  Pollutants and many other irritants. The cells lining  the bronchial tree are covered with tiny hairs (cilia). These constantly beat upward, away from the lungs, toward the mouth. This keeps the lungs free of pollutants. When these cells become too irritated and are unable to do their job, mucus begins to develop. This causes the characteristic cough of bronchitis. The cough clears the lungs when the cilia are unable to do their job. Without either of these protective mechanisms, the mucus would settle in the lungs. Then you would develop pneumonia. Smoking is a common cause of bronchitis and can contribute to pneumonia. Stopping this habit is the single most important thing you can do to help yourself. TREATMENT   Your caregiver may prescribe an antibiotic if the cough is caused by bacteria. Also, medicines that open up your airways make it easier to breathe. Your caregiver may also recommend or prescribe an expectorant. It will loosen the mucus to be coughed up. Only take over-the-counter or prescription medicines for pain, discomfort, or fever as directed by your caregiver.  Removing whatever causes the problem (smoking, for example) is critical to preventing the problem from getting worse.  Cough suppressants may be prescribed for relief of cough symptoms.  Inhaled medicines may be prescribed to help with symptoms now and  to help prevent problems from returning.  For those with recurrent (chronic) bronchitis, there may be a need for steroid medicines. SEEK IMMEDIATE MEDICAL CARE IF:   During treatment, you develop more pus-like mucus (purulent sputum).  You have a fever.  Your baby is older than 3 months with a rectal temperature of 102 F (38.9 C) or higher.  Your baby is 55 months old or younger with a rectal temperature of 100.4 F (38 C) or higher.  You become progressively more ill.  You have increased difficulty breathing, wheezing, or shortness of breath. It is necessary to seek immediate medical care if you are elderly or sick from  any other disease. MAKE SURE YOU:   Understand these instructions.  Will watch your condition.  Will get help right away if you are not doing well or get worse. Document Released: 06/26/2005 Document Revised: 09/18/2011 Document Reviewed: 05/05/2008 [facility] Patient Information 2013 [place], [place].

## 2012-10-08 NOTE — Progress Notes (Signed)
Pt verified she has tried ibuprofen, aleve and mobic for her tendinitis. Prior auth approved for Voltaren gel through 10/08/13. Faxed to pharmacy.

## 2012-11-09 ENCOUNTER — Other Ambulatory Visit: Payer: Self-pay | Admitting: Physician Assistant

## 2012-12-03 ENCOUNTER — Ambulatory Visit (INDEPENDENT_AMBULATORY_CARE_PROVIDER_SITE_OTHER): Payer: BC Managed Care – PPO | Admitting: Family Medicine

## 2012-12-03 ENCOUNTER — Ambulatory Visit: Payer: BC Managed Care – PPO

## 2012-12-03 VITALS — BP 124/84 | HR 80 | Temp 98.1°F | Resp 18 | Wt 287.0 lb

## 2012-12-03 DIAGNOSIS — H6121 Impacted cerumen, right ear: Secondary | ICD-10-CM

## 2012-12-03 DIAGNOSIS — H6120 Impacted cerumen, unspecified ear: Secondary | ICD-10-CM

## 2012-12-03 DIAGNOSIS — M79671 Pain in right foot: Secondary | ICD-10-CM

## 2012-12-03 DIAGNOSIS — M79609 Pain in unspecified limb: Secondary | ICD-10-CM

## 2012-12-03 DIAGNOSIS — H9209 Otalgia, unspecified ear: Secondary | ICD-10-CM

## 2012-12-03 DIAGNOSIS — M7731 Calcaneal spur, right foot: Secondary | ICD-10-CM

## 2012-12-03 DIAGNOSIS — H9201 Otalgia, right ear: Secondary | ICD-10-CM

## 2012-12-03 MED ORDER — DICLOFENAC SODIUM 75 MG PO TBEC: 75.0000 mg | DELAYED_RELEASE_TABLET | Freq: Two times a day (BID) | ORAL | Status: DC

## 2012-12-03 NOTE — Progress Notes (Signed)
Subjective: April Duncan is a 37 year old [employer], [employer]. She has been having 2 problems that she is here for today. She has a right ear which is been hurting her for a couple of weeks. She has prior history of cerumen impaction issues.  Objective:  The right ear is full of wax except for a little pinhole through the wax. Left has just a little bit of wax on the superior portion of the canal primarily. Throat is clear. Neck supple without nodes.  The right heel is tender on the posterior aspect of the calcaneus just near the attachment of the Achilles.  Assessment: Heel pain, suspicious for heel spurring and tendinitis Right otalgia with cerumen impaction  Plan: Irrigate the cerumen from years. X-ray heel  [facility] reading (PRIMARY) by  Dr. Leda Roys spurring  Wear cam walker and take Voltaren. If the pain persists after 2 or 3 weeks we will send you to an orthopedist..  My assistant attempted irrigation of the ear after using ear wax softening drops. She was unable to get the right ear clean. I was able to with irrigation and curettage to remove the plug of wax.

## 2012-12-03 NOTE — Patient Instructions (Addendum)
Wear a Cam Walker to rest your heel..   Take the Voltaren one twice daily at breakfast and supper.   If pain persists after 2-3 weeks we will refer him to an orthopedist.

## 2013-03-12 ENCOUNTER — Ambulatory Visit (INDEPENDENT_AMBULATORY_CARE_PROVIDER_SITE_OTHER): Payer: BC Managed Care – PPO | Admitting: Family Medicine

## 2013-03-12 VITALS — BP 134/90 | HR 80 | Temp 98.6°F | Resp 18 | Ht 64.0 in | Wt 292.0 lb

## 2013-03-12 DIAGNOSIS — M545 Low back pain: Secondary | ICD-10-CM

## 2013-03-12 DIAGNOSIS — M7062 Trochanteric bursitis, left hip: Secondary | ICD-10-CM

## 2013-03-12 DIAGNOSIS — M76899 Other specified enthesopathies of unspecified lower limb, excluding foot: Secondary | ICD-10-CM

## 2013-03-12 DIAGNOSIS — E119 Type 2 diabetes mellitus without complications: Secondary | ICD-10-CM

## 2013-03-12 MED ORDER — MELOXICAM 15 MG PO TABS: 15.0000 mg | ORAL_TABLET | Freq: Every day | ORAL | Status: DC

## 2013-03-12 MED ORDER — FEXOFENADINE HCL 180 MG PO TABS: 180.0000 mg | ORAL_TABLET | Freq: Every day | ORAL | Status: AC

## 2013-03-12 MED ORDER — CYCLOBENZAPRINE HCL 10 MG PO TABS: 10.0000 mg | ORAL_TABLET | Freq: Every evening | ORAL | Status: DC | PRN

## 2013-03-12 MED ORDER — GLUCOSE BLOOD VI STRP: ORAL_STRIP | Status: DC

## 2013-03-12 MED ORDER — METFORMIN HCL ER 750 MG PO TB24: 750.0000 mg | ORAL_TABLET | Freq: Two times a day (BID) | ORAL | Status: DC

## 2013-03-12 NOTE — Patient Instructions (Signed)
Thank you for coming in today  Refilled metformin and allegra Start meloxicam 1 x daily for 7 days then prn Use flexeril at night as needed for spasm Use heat and ice on back pain Refer to PT to learn exercises.  Followup 3 weeks with Dr. Georgia Lopes.  Lumbosacral Strain Lumbosacral strain is one of the most common causes of back pain. There are many causes of back pain. Most are not serious conditions. CAUSES  Your backbone (spinal column) is made up of 24 main vertebral bodies, the sacrum, and the coccyx. These are held together by muscles and tough, fibrous tissue (ligaments). Nerve roots pass through the openings between the vertebrae. A sudden move or injury to the back may cause injury to, or pressure on, these nerves. This may result in localized back pain or pain movement (radiation) into the buttocks, down the leg, and into the foot. Sharp, shooting pain from the buttock down the back of the leg (sciatica) is frequently associated with a ruptured (herniated) disk. Pain may be caused by muscle spasm alone. Your caregiver can often find the cause of your pain by the details of your symptoms and an exam. In some cases, you may need tests (such as X-rays). Your caregiver will work with you to decide if any tests are needed based on your specific exam. HOME CARE INSTRUCTIONS   Avoid an underactive lifestyle. Active exercise, as directed by your caregiver, is your greatest weapon against back pain.  Avoid hard physical activities (tennis, racquetball, waterskiing) if you are not in proper physical condition for it. This may aggravate or create problems.  If you have a back problem, avoid sports requiring sudden body movements. Swimming and walking are generally safer activities.  Maintain good posture.  Avoid becoming overweight (obese).  Use bed rest for only the most extreme, sudden (acute) episode. Your caregiver will help you determine how much bed rest is necessary.  For acute  conditions, you may put ice on the injured area.  Put ice in a plastic bag.  Place a towel between your skin and the bag.  Leave the ice on for 15-20 minutes at a time, every 2 hours, or as needed.  After you are improved and more active, it may help to apply heat for 30 minutes before activities. See your caregiver if you are having pain that lasts longer than expected. Your caregiver can advise appropriate exercises or therapy if needed. With conditioning, most back problems can be avoided. SEEK IMMEDIATE MEDICAL CARE IF:   You have numbness, tingling, weakness, or problems with the use of your arms or legs.  You experience severe back pain not relieved with medicines.  There is a change in bowel or bladder control.  You have increasing pain in any area of the body, including your belly (abdomen).  You notice shortness of breath, dizziness, or feel faint.  You feel sick to your stomach (nauseous), are throwing up (vomiting), or become sweaty.  You notice discoloration of your toes or legs, or your feet get very cold.  Your back pain is getting worse.  You have a fever. MAKE SURE YOU:   Understand these instructions.  Will watch your condition.  Will get help right away if you are not doing well or get worse. Document Released: 04/05/2005 Document Revised: 09/18/2011 Document Reviewed: 09/25/2008 [facility] Patient Information 2014 [place], [place].

## 2013-03-12 NOTE — Progress Notes (Signed)
  Subjective:    Patient ID: April Duncan, female    DOB: 15-Dec-1975, 37 y.o.   MRN: [id number]  HPI Pain in left hip for 3-4 days. Pain in posteriolateral gluteus and shoots down lateral leg to the top of the foot. Pain is achy. No injury but has been walking more. Has some low back pain. No numbness, tingling, weakness in the leg. Not feeling like leg is going to give. No incontinence of bowel or bladder. No history of similar problems. Has tried some aleve, no relief. Heating pad to low back helps.  Patient also presents for refills of metformin and allegra. Most recent A1c > 1 year ago was 6.0. Has been out of meformin for 1 month. Many sugars recently in 200-300s.  Allegra works well at controlling allergies.  Review of Systems  Constitutional: Negative.   All other systems reviewed and are negative.      Objective:   Physical Exam  Constitutional: She is oriented to person, place, and time. She appears well-developed and well-nourished. No distress.  HENT:  Head: Normocephalic and atraumatic.  Eyes: Conjunctivae are normal. Pupils are equal, round, and reactive to light.  Musculoskeletal:       Left hip: She exhibits normal range of motion and normal strength.       Lumbar back: She exhibits tenderness, bony tenderness and pain.  Neurological: She is alert and oriented to person, place, and time.  Skin: Skin is warm and dry.  Psychiatric: She has a normal mood and affect. Her behavior is normal.  MSK: FROM in flexion and extension. Pain with extension. 5/5 strength BLE. Normal sensation to light touches. Reflexes normal symmetrically BLE. Neg SLR.  Results for orders placed in visit on 03/12/13  POCT GLYCOSYLATED HEMOGLOBIN (HGB A1C)      Result Value Range   Hemoglobin A1C 6.1         Assessment & Plan:  #1. LBP with left greater troch pain and tenderness. Acute in duration at this time. No red flags today. Some radiation concerning for lumbar radiculopathy but negative  exam. Exam more consistent with MSK LBP and troch bursitis. Would recommend NSAIDs, flexeril, heat/ice, and PT.  #2. DM. Check A1c today. Restart metformin. Check BG 1x per day at different times of day. F/u Dr. Georgia Lopes in 3 weeks.  #3. Allergies. Refill allegra.

## 2013-03-13 NOTE — Progress Notes (Signed)
Patient discussed with Dr. Voss. Agree with assessment and plan of care per her note.   

## 2013-05-22 ENCOUNTER — Ambulatory Visit (INDEPENDENT_AMBULATORY_CARE_PROVIDER_SITE_OTHER): Payer: BC Managed Care – PPO | Admitting: Emergency Medicine

## 2013-05-22 VITALS — BP 130/88 | HR 92 | Temp 98.8°F | Resp 16 | Ht 64.5 in | Wt 290.0 lb

## 2013-05-22 DIAGNOSIS — E119 Type 2 diabetes mellitus without complications: Secondary | ICD-10-CM

## 2013-05-22 DIAGNOSIS — J018 Other acute sinusitis: Secondary | ICD-10-CM

## 2013-05-22 DIAGNOSIS — J309 Allergic rhinitis, unspecified: Secondary | ICD-10-CM

## 2013-05-22 MED ORDER — AMOXICILLIN-POT CLAVULANATE 875-125 MG PO TABS: 1.0000 | ORAL_TABLET | Freq: Two times a day (BID) | ORAL | Status: DC

## 2013-05-22 MED ORDER — PSEUDOEPHEDRINE-GUAIFENESIN ER 60-600 MG PO TB12: 1.0000 | ORAL_TABLET | Freq: Two times a day (BID) | ORAL | Status: AC

## 2013-05-22 MED ORDER — METFORMIN HCL ER 750 MG PO TB24: 750.0000 mg | ORAL_TABLET | Freq: Every day | ORAL | Status: DC

## 2013-05-22 MED ORDER — TRIAMCINOLONE ACETONIDE 0.1 % EX CREA: 1.0000 "application " | TOPICAL_CREAM | Freq: Two times a day (BID) | CUTANEOUS | Status: DC

## 2013-05-22 MED ORDER — FLUTICASONE PROPIONATE 50 MCG/ACT NA SUSP: 2.0000 | Freq: Every day | NASAL | Status: DC

## 2013-05-22 NOTE — Patient Instructions (Signed)

## 2013-05-22 NOTE — Progress Notes (Signed)
[facility] [address], [place] [address] [phone]  Date:  05/22/2013   Name:  April Duncan   DOB:  [birth year]   MRN:  [id number]  PCP:  Lucilla Edin, MD    Chief Complaint: URI and Medication Refill   History of Present Illness:  April Duncan is a 37 y.o. very pleasant female patient who presents with the following:  Says that she has nasal congestion and drainage for the past month despite use of netty pot.  Some post nasal drainage. No fever or chills.  Non productive occasional cough.  No wheezing or shortness of breath.  No improvement with over the counter medications or other home remedies.   Is taking her metformin with improvement in her sugars.  No nausea or vomiting. No stool change. No rash.  No improvement with over the counter medications or other home remedies. Denies other complaint or health concern today.   Patient Active Problem List   Diagnosis Date Noted  . Morbid obesity with BMI of 50.0-59.9, adult 03/12/2013  . Diabetes mellitus 08/19/2011  . Hypertension 08/19/2011  . PCOS (polycystic ovarian syndrome) 08/19/2011  . 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 Diabetes mellitus   . Hypertension   . PCOS (polycystic ovarian syndrome)   . Allergy   . Asthma   . Anemia     Past Surgical History  Procedure Laterality Date  . Gallbladder surgery    . Hernia repair    . Tubal ligation    . Cholecystectomy      History  Substance Use Topics  . Smoking status: Never Smoker   . Smokeless tobacco: Not on file  . Alcohol Use: 0.5 oz/week    1 drink(s) per week    Family History  Problem Relation Age of Onset  . Hypertension Mother   . Depression Father   . Depression Brother   . Hypertension Maternal Grandmother     No Known Allergies  Medication list has been reviewed and updated.  Current Outpatient  Prescriptions on File Prior to Visit  Medication Sig Dispense Refill  . albuterol (PROVENTIL HFA;VENTOLIN HFA) 108 (90 BASE) MCG/ACT inhaler Inhale 2 puffs into the lungs every 6 (six) hours as needed for wheezing.  1 Inhaler  4  . beclomethasone (QVAR) 80 MCG/ACT inhaler Inhale 2 puffs into the lungs 2 (two) times daily.  1 Inhaler  12  . fexofenadine (ALLEGRA) 180 MG tablet Take 1 tablet (180 mg total) by mouth daily.  30 tablet  11  . glucose blood test strip Use as instructed  100 each  12  . lisinopril-hydrochlorothiazide (PRINZIDE,ZESTORETIC) 20-12.5 MG per tablet Take 1 tablet by mouth daily. Need office visit for additional refills.  30 tablet  11  . metFORMIN (GLUCOPHAGE-XR) 750 MG 24 hr tablet Take 1 tablet (750 mg total) by mouth 2 (two) times daily. Needs office visit (second notice)  60 tablet  2  . metoprolol succinate (TOPROL-XL) 100 MG 24 hr tablet Take 1 tablet (100 mg total) by mouth daily. Take with or immediately following a meal.  90 tablet  3  . cyclobenzaprine (FLEXERIL) 10 MG tablet Take 1 tablet (10 mg total) by mouth at bedtime as needed for muscle spasms.  30 tablet  0   No current facility-administered medications on file prior to visit.    Review of Systems:  As per HPI, otherwise negative.    Physical Examination: Filed Vitals:   05/22/13 1946  BP: 130/88  Pulse: 92  Temp: 98.8 F (37.1 C)  Resp: 16   Filed Vitals:   05/22/13 1946  Height: 5' 4.5" (1.638 m)  Weight: 290 lb (131.543 kg)   Body mass index is 49.03 kg/(m^2). Ideal Body Weight: Weight in (lb) to have BMI = 25: 147.6  GEN: WDWN, NAD, Non-toxic, A & O x 3 HEENT: Atraumatic, Normocephalic. Neck supple. No masses, No LAD. Ears and Nose: No external deformity.  Green nasal drainage CV: RRR, No M/G/R. No JVD. No thrill. No extra heart sounds. PULM: CTA B, no wheezes, crackles, rhonchi. No retractions. No resp. distress. No accessory muscle use. ABD: S, NT, ND, +BS. No rebound. No  HSM. EXTR: No c/c/e NEURO Normal gait.  PSYCH: Normally interactive. Conversant. Not depressed or anxious appearing.  Calm demeanor.    Assessment and Plan: Sinusitis eczema NIDDM flonase mucinex d augmentin  Signed,  Phillips Odor, MD

## 2013-06-18 ENCOUNTER — Other Ambulatory Visit: Payer: Self-pay | Admitting: Emergency Medicine

## 2013-07-31 ENCOUNTER — Encounter: Payer: Self-pay | Admitting: Family Medicine

## 2013-07-31 ENCOUNTER — Ambulatory Visit (INDEPENDENT_AMBULATORY_CARE_PROVIDER_SITE_OTHER): Payer: BC Managed Care – PPO | Admitting: Family Medicine

## 2013-07-31 VITALS — BP 138/108 | HR 97 | Temp 98.8°F | Resp 16 | Ht 64.0 in | Wt 289.8 lb

## 2013-07-31 DIAGNOSIS — R739 Hyperglycemia, unspecified: Secondary | ICD-10-CM

## 2013-07-31 DIAGNOSIS — R21 Rash and other nonspecific skin eruption: Secondary | ICD-10-CM

## 2013-07-31 DIAGNOSIS — L309 Dermatitis, unspecified: Secondary | ICD-10-CM

## 2013-07-31 DIAGNOSIS — I10 Essential (primary) hypertension: Secondary | ICD-10-CM

## 2013-07-31 DIAGNOSIS — J45909 Unspecified asthma, uncomplicated: Secondary | ICD-10-CM

## 2013-07-31 DIAGNOSIS — E119 Type 2 diabetes mellitus without complications: Secondary | ICD-10-CM

## 2013-07-31 LAB — COMPREHENSIVE METABOLIC PANEL
ALT: 23 U/L (ref 0–35)
AST: 23 U/L (ref 0–37)
Albumin: 3.9 g/dL (ref 3.5–5.2)
Alkaline Phosphatase: 98 U/L (ref 39–117)
BUN: 14 mg/dL (ref 6–23)
CO2: 25 mEq/L (ref 19–32)
Calcium: 9.3 mg/dL (ref 8.4–10.5)
Chloride: 101 mEq/L (ref 96–112)
Creat: 0.66 mg/dL (ref 0.50–1.10)
Glucose, Bld: 105 mg/dL — ABNORMAL HIGH (ref 70–99)
Potassium: 3.5 mEq/L (ref 3.5–5.3)
Sodium: 138 mEq/L (ref 135–145)
Total Bilirubin: 0.3 mg/dL (ref 0.3–1.2)
Total Protein: 7.3 g/dL (ref 6.0–8.3)

## 2013-07-31 LAB — LIPID PANEL
Cholesterol: 100 mg/dL (ref 0–200)
HDL: 58 mg/dL (ref >39)
LDL Cholesterol: 33 mg/dL (ref 0–99)
Total CHOL/HDL Ratio: 1.7 Ratio
Triglycerides: 44 mg/dL (ref <150)
VLDL: 9 mg/dL (ref 0–40)

## 2013-07-31 LAB — POCT GLYCOSYLATED HEMOGLOBIN (HGB A1C): Hemoglobin A1C: 5.8

## 2013-07-31 MED ORDER — LISINOPRIL-HYDROCHLOROTHIAZIDE 20-12.5 MG PO TABS
1.0000 | ORAL_TABLET | Freq: Every day | ORAL | Status: DC
Start: 2013-07-31 — End: 2014-10-09

## 2013-07-31 MED ORDER — ALBUTEROL SULFATE HFA 108 (90 BASE) MCG/ACT IN AERS: 2.0000 | INHALATION_SPRAY | Freq: Four times a day (QID) | RESPIRATORY_TRACT | Status: DC | PRN

## 2013-07-31 MED ORDER — SITAGLIPTIN PHOS-METFORMIN HCL 50-1000 MG PO TABS: 1.0000 | ORAL_TABLET | Freq: Two times a day (BID) | ORAL | Status: DC

## 2013-07-31 MED ORDER — TRIAMCINOLONE ACETONIDE 0.1 % EX CREA: TOPICAL_CREAM | Freq: Two times a day (BID) | CUTANEOUS | Status: DC

## 2013-07-31 MED ORDER — FLUTICASONE PROPIONATE 50 MCG/ACT NA SUSP: 2.0000 | Freq: Every day | NASAL | Status: DC

## 2013-07-31 MED ORDER — BECLOMETHASONE DIPROPIONATE 80 MCG/ACT IN AERS: 2.0000 | INHALATION_SPRAY | Freq: Two times a day (BID) | RESPIRATORY_TRACT | Status: DC

## 2013-07-31 MED ORDER — SILVER SULFADIAZINE 1 % EX CREA: 1.0000 "application " | TOPICAL_CREAM | Freq: Every day | CUTANEOUS | Status: DC

## 2013-07-31 MED ORDER — METOPROLOL SUCCINATE ER 100 MG PO TB24: 100.0000 mg | ORAL_TABLET | Freq: Every day | ORAL | Status: DC

## 2013-07-31 NOTE — Progress Notes (Signed)
Subjective:  This chart was scribed for Robyn Haber, MD by Donato Schultz, Medical Scribe. This patient was seen in Room 27 and the patient's care was started at 11:55 AM.   Patient ID: April Duncan, female    DOB: 17-Jun-1976, 38 y.o.   MRN: [id number]  HPI HPI Comments: April Duncan is a 38 y.o. female with a history of Hypertension and DM who [facility] the [facility] needing a refill on her prescriptions.  She states that she has not run out of any of her medications.  She states that she has stopped taking her Metformin.  She states that she need a refill on her Flonase, QVAR, and Albuterol.    She states that she noticed dry skin on the left side of her face and bilateral hands a month ago.  She suspects that it is from her medication.  She denies changing her medication or using any new soaps or detergents.  She states that she has a rash on her lower back.  The patient states that she has experienced this rash before and it was successfully treated with Silver Sulfadiazine.    She states that her sugar levels have been within normal ranges when she checks it.  She states that she has a chronic sinusitis but states that her sinus are not bothering her now.  She states that she would like to manage her weight.  She states that she got a Yoga mat, Yoga DVD, and Pedometer for Christmas.  She states that she has signed up for a weight loss challenge at work but has been unsuccessful in trying to manage her weight on her own.    She suspects that her blood pressure was elevated due to a stressful four hour meeting at [facility] she sat in on.    She is also complaining of a painful lump on her right heel.  She states that she wears orthotics.  She states that she has tried applying a topical cream and taking a pill for her symptom with no relief.    The patient is also complaining of a black spot on the toenail of her right big toe that she noticed a couple  of months ago.  She states that it is not causing any pain.    She states that she went to [place] for the holidays.    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  She is also in school for nursing.    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   Social History Main Topics   Smoking status: Never Smoker    Smokeless  tobacco: Not on file   Alcohol Use: 0.5 oz/week    1 drink(s) per week   Drug Use: No   Sexual Activity: Yes    Birth Control/ Protection: None   Other Topics Concern   Not on file   Social History Narrative   No narrative on file   No Known Allergies   Review of Systems  Skin: Positive for rash (lower back).  All other systems reviewed and are negative.     Objective:  Physical Exam  Nursing note and vitals reviewed. Constitutional: She is oriented to person, place, and time. She appears well-developed and well-nourished. No distress.  HENT:  Head: Normocephalic and atraumatic.  Right Ear: External ear normal.  Left Ear: External ear normal.  Mouth/Throat: Oropharynx is clear and moist. No oropharyngeal exudate.  Eyes: EOM are normal.  Neck: Neck supple. No tracheal deviation present. No thyromegaly present.  Cardiovascular: Normal rate.   Pulmonary/Chest: Effort  normal and breath sounds normal.  Musculoskeletal: Normal range of motion.  Lymphadenopathy:    She has no cervical adenopathy.  Neurological: She is alert and oriented to person, place, and time.  Skin: Skin is warm and dry. Rash noted.  Psychiatric: She has a normal mood and affect. Her behavior is normal.   BP recheck 130/90 Results for orders placed in visit on 07/31/13  POCT GLYCOSYLATED HEMOGLOBIN (HGB A1C)      Result Value Range   Hemoglobin A1C 5.8       BP 138/108   Pulse 97   Temp(Src) 98.8 F (37.1 C) (Oral)   Resp 16   Ht 5' 4"  (1.626 m)   Wt 289 lb 12.8 oz (131.452 kg)   BMI 49.72 kg/m2   SpO2 98%   LMP 07/14/2013 Assessment & Plan:  Eczema - Plan: triamcinolone cream (KENALOG) 0.1 %  Accelerated hypertension - Plan: lisinopril-hydrochlorothiazide (PRINZIDE,ZESTORETIC) 20-12.5 MG per tablet, metoprolol succinate (TOPROL-XL) 100 MG 24 hr tablet, POCT glycosylated hemoglobin (Hb A1C), Comprehensive metabolic panel, Lipid panel  Asthma - Plan: fluticasone (FLONASE) 50 MCG/ACT nasal spray, albuterol (PROVENTIL HFA;VENTOLIN HFA) 108 (90 BASE) MCG/ACT inhaler, beclomethasone (QVAR) 80 MCG/ACT inhaler  Rash and nonspecific skin eruption - Plan: silver sulfADIAZINE (SILVADENE) 1 % cream  Hyperglycemia - Plan: sitaGLIPtin-metformin (JANUMET) 50-1000 MG per tablet, POCT glycosylated hemoglobin (Hb A1C), Comprehensive metabolic panel, Lipid panel  Signed, Robyn Haber, MD    I personally performed the services described in this documentation, which was scribed in my presence. The recorded information has been reviewed and is accurate.

## 2013-08-28 ENCOUNTER — Ambulatory Visit (INDEPENDENT_AMBULATORY_CARE_PROVIDER_SITE_OTHER): Payer: BC Managed Care – PPO | Admitting: Podiatrist

## 2013-08-28 ENCOUNTER — Encounter: Payer: Self-pay | Admitting: Podiatrist

## 2013-08-28 ENCOUNTER — Ambulatory Visit (INDEPENDENT_AMBULATORY_CARE_PROVIDER_SITE_OTHER): Payer: BC Managed Care – PPO

## 2013-08-28 VITALS — BP 160/95 | HR 83 | Resp 16 | Ht 64.0 in | Wt 280.0 lb

## 2013-08-28 DIAGNOSIS — M79673 Pain in unspecified foot: Secondary | ICD-10-CM

## 2013-08-28 DIAGNOSIS — M79609 Pain in unspecified limb: Secondary | ICD-10-CM

## 2013-08-28 NOTE — Progress Notes (Signed)
Chief Complaint  Patient presents with  . Foot Pain    i have a bump on the back of my ankle-right     HPI: Patient is 38 y.o. female who presents today for pain on the back of her right heel.  Patient states it is umcomfortable in shoes and with pressure.  She relates no pain in the left heel and denies trauma or injury.    Physical Exam  GENERAL APPEARANCE: Alert, conversant. Appropriately groomed. No acute distress.  VASCULAR: Pedal pulses palpable and strong bilateral.  Capillary refill time is immediate to all digits,  Proximal to distal cooling it warm to warm.  Digital hair growth is present bilateral  NEUROLOGIC: sensation is intact epicritically and protectively to 5.07 monofilament at 5/5 sites bilateral.  Light touch is intact bilateral, vibratory sensation intact bilateral, achilles tendon reflex is intact bilateral.  MUSCULOSKELETAL: acceptable muscle strength, tone and stability bilateral.  Intrinsic muscluature intact bilateral.  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  xrays confirm haglunds deformity is present  DERMATOLOGIC: skin color, texture, and turger are within normal limits.  Mild swelling on the posterior left heel is present at haglunds deformity site  Assessment:haglunds deformity  Plan: recommended antiinflammatory medication, bracing and icing and stretching exercises were dispensed.  I will see her back in 4 weeks for a recheck.    Telesha Deguzman P

## 2013-08-28 NOTE — Patient Instructions (Signed)

## 2013-09-05 ENCOUNTER — Telehealth: Payer: Self-pay

## 2013-09-05 DIAGNOSIS — R739 Hyperglycemia, unspecified: Secondary | ICD-10-CM

## 2013-09-05 MED ORDER — SITAGLIPTIN PHOS-METFORMIN HCL 50-1000 MG PO TABS: 1.0000 | ORAL_TABLET | Freq: Two times a day (BID) | ORAL | Status: DC

## 2013-09-05 NOTE — Telephone Encounter (Signed)
Sent in for 60 tablets.  Pt notified

## 2013-09-05 NOTE — Telephone Encounter (Signed)
Patient is not able to get refills on her janumet - it is written for 15 days with five refills.   [phone]

## 2013-09-24 ENCOUNTER — Ambulatory Visit: Payer: BC Managed Care – PPO | Admitting: Podiatrist

## 2013-09-24 ENCOUNTER — Ambulatory Visit (INDEPENDENT_AMBULATORY_CARE_PROVIDER_SITE_OTHER): Payer: BC Managed Care – PPO | Admitting: Podiatrist

## 2013-09-24 VITALS — BP 155/98 | HR 80 | Resp 16 | Ht 64.0 in | Wt 280.0 lb

## 2013-09-24 DIAGNOSIS — M928 Other specified juvenile osteochondrosis: Secondary | ICD-10-CM

## 2013-09-24 DIAGNOSIS — M9261 Juvenile osteochondrosis of tarsus, right ankle: Secondary | ICD-10-CM

## 2013-09-24 NOTE — Progress Notes (Signed)
Subjective: April Duncan presents today for followup of pain posterior aspect of the right foot. She states that the discomfort has improved and it no longer bothers her on a daily basis. She states that it hurts sometimes at the end of the day however she feels it improved significantly with the use of the night splint, ice pack, Voltaren gel. She's now. The pain the lateral aspect of the left foot.  Objective: Neurovascular status is intact with palpable pulses at 2/4 DP and PT bilateral. Neurological sensation is also intact bilateral. Mild pain along the posterior aspect of the right foot at the area of the Haglund's deformity is present. Much improved from the last visit. Generalized lateral foot pain is noted left.  Assessment: Achilles tendinitis, Haglund's deformity right. Compensation lateral aspect left foot  Plan: Recommended stretching exercises for the left foot and these were printed out for her. Recommended continued use of the Voltaren gel on the right and left foot. She to continue wearing the night splint and icing on the right heel. I will see her back in 3 weeks for followup and may require x-ray of the left foot if it's not improved. She also does want to start walking and I recommended a very slow regimen for getting back into walking and eventually running for exercise.

## 2013-09-24 NOTE — Patient Instructions (Signed)
EXERCISES-- perform each exercise a total of 10-15 repetitions.  Hold for 30 seconds and perform 3 times per day   RANGE OF MOTION (ROM) AND STRETCHING EXERCISES - Plantar Fasciitis (Heel Spur Syndrome) These exercises may help you when beginning to rehabilitate your injury.   While completing these exercises, remember:   Restoring tissue flexibility helps normal motion to return to the joints. This allows healthier, less painful movement and activity.  An effective stretch should be held for at least 30 seconds.  A stretch should never be painful. You should only feel a gentle lengthening or release in the stretched tissue. RANGE OF MOTION - Toe Extension, Flexion  Sit with your right / left leg crossed over your opposite knee.  Grasp your toes and gently pull them back toward the top of your foot. You should feel a stretch on the bottom of your toes and/or foot.  Hold this stretch for __________ seconds.  Now, gently pull your toes toward the bottom of your foot. You should feel a stretch on the top of your toes and or foot.  Hold this stretch for __________ seconds. Repeat __________ times. Complete this stretch __________ times per day.  RANGE OF MOTION - Ankle Dorsiflexion, Active Assisted  Remove shoes and sit on a chair that is preferably not on a carpeted surface.  Place right / left foot under knee. Extend your opposite leg for support.  Keeping your heel down, slide your right / left foot back toward the chair until you feel a stretch at your ankle or calf. If you do not feel a stretch, slide your bottom forward to the edge of the chair, while still keeping your heel down.  Hold this stretch for __________ seconds. Repeat __________ times. Complete this stretch __________ times per day.  STRETCH  Gastroc, Standing  Place hands on wall.  Extend right / left leg, keeping the front knee somewhat bent.  Slightly point your toes inward on your back foot.  Keeping  your right / left heel on the floor and your knee straight, shift your weight toward the wall, not allowing your back to arch.  You should feel a gentle stretch in the right / left calf. Hold this position for __________ seconds. Repeat __________ times. Complete this stretch __________ times per day. STRETCH  Soleus, Standing  Place hands on wall.  Extend right / left leg, keeping the other knee somewhat bent.  Slightly point your toes inward on your back foot.  Keep your right / left heel on the floor, bend your back knee, and slightly shift your weight over the back leg so that you feel a gentle stretch deep in your back calf.  Hold this position for __________ seconds. Repeat __________ times. Complete this stretch __________ times per day. STRETCH  Gastrocsoleus, Standing  Note: This exercise can place a lot of stress on your foot and ankle. Please complete this exercise only if specifically instructed by your caregiver.   Place the ball of your right / left foot on a step, keeping your other foot firmly on the same step.  Hold on to the wall or a rail for balance.  Slowly lift your other foot, allowing your body weight to press your heel down over the edge of the step.  You should feel a stretch in your right / left calf.  Hold this position for __________ seconds.  Repeat this exercise with a slight bend in your right / left knee. Repeat  __________ times. Complete this stretch __________ times per day.  STRENGTHENING EXERCISES - Plantar Fasciitis (Heel Spur Syndrome)  These exercises may help you when beginning to rehabilitate your injury. They may resolve your symptoms with or without further involvement from your physician, physical therapist or athletic trainer. While completing these exercises, remember:   Muscles can gain both the endurance and the strength needed for everyday activities through controlled exercises.  Complete these exercises as instructed by your  physician, physical therapist or athletic trainer. Progress the resistance and repetitions only as guided.

## 2013-11-26 ENCOUNTER — Ambulatory Visit (INDEPENDENT_AMBULATORY_CARE_PROVIDER_SITE_OTHER): Payer: BC Managed Care – PPO | Admitting: Family Medicine

## 2013-11-26 VITALS — BP 118/76 | HR 91 | Temp 100.2°F | Resp 18 | Ht 63.0 in | Wt 291.0 lb

## 2013-11-26 DIAGNOSIS — E119 Type 2 diabetes mellitus without complications: Secondary | ICD-10-CM

## 2013-11-26 DIAGNOSIS — D649 Anemia, unspecified: Secondary | ICD-10-CM

## 2013-11-26 DIAGNOSIS — N920 Excessive and frequent menstruation with regular cycle: Secondary | ICD-10-CM

## 2013-11-26 DIAGNOSIS — I10 Essential (primary) hypertension: Secondary | ICD-10-CM

## 2013-11-26 DIAGNOSIS — R079 Chest pain, unspecified: Secondary | ICD-10-CM

## 2013-11-26 DIAGNOSIS — M791 Myalgia, unspecified site: Secondary | ICD-10-CM

## 2013-11-26 LAB — POCT CBC
Granulocyte percent: 74 %G (ref 37–80)
HEMATOCRIT: 32.5 % — AB (ref 37.7–47.9)
HEMOGLOBIN: 10 g/dL — AB (ref 12.2–16.2)
LYMPH, POC: 1.9 (ref 0.6–3.4)
MCH, POC: 24 pg — AB (ref 27–31.2)
MCHC: 30.8 g/dL — AB (ref 31.8–35.4)
MCV: 78 fL — AB (ref 80–97)
MID (cbc): 0.5 (ref 0–0.9)
MPV: 8.9 fL (ref 0–99.8)
POC GRANULOCYTE: 6.8 (ref 2–6.9)
POC LYMPH PERCENT: 20.4 %L (ref 10–50)
POC MID %: 5.6 %M (ref 0–12)
Platelet Count, POC: 321 10*3/uL (ref 142–424)
RBC: 4.17 M/uL (ref 4.04–5.48)
RDW, POC: 16.6 %
WBC: 9.2 10*3/uL (ref 4.6–10.2)

## 2013-11-26 LAB — GLUCOSE, POCT (MANUAL RESULT ENTRY): POC Glucose: 110 mg/dl — AB (ref 70–99)

## 2013-11-26 LAB — POCT GLYCOSYLATED HEMOGLOBIN (HGB A1C): Hemoglobin A1C: 5.6

## 2013-11-26 MED ORDER — IBUPROFEN 200 MG PO TABS
600.0000 mg | ORAL_TABLET | Freq: Once | ORAL | Status: AC
  Administered 2013-11-26: 600 mg via ORAL

## 2013-11-26 MED ORDER — IBUPROFEN 200 MG PO TABS: 200.0000 mg | ORAL_TABLET | Freq: Once | ORAL | Status: DC

## 2013-11-26 NOTE — Progress Notes (Signed)
Subjective:    Patient ID: April Duncan, female    DOB: 1975-12-31, 38 y.o.   MRN: [id number]  HPI Patient presents today with 2 day history of fever/chills (subjective), aches, occasional chest pains with sharp pain that lasts for a few minutes. Has had many episodes, too many to count. These have only occurred over the last 2 days. Have happened with normal activity and with rest. No disruption of sleep with these. No radiation to arms, neck. No association with eating. No diaphoresis. Feels somewhat short of breath with this illness, not related to chest pain.   Has felt achy. No unusual activity over last week. Will sometimes feel fatigued and has heavy periods (7 days, 5 days heavy) every other cycle, she will take iron pills when she starts to feel weak. She took three iron pills today. This will sometimes cause stomach upset.   No recent hiking/camping, no known tick exposure.  Review of Systems No wheezing, no nasal congestion, no sore throat, no cough. Has viral URI symptoms last week, resolved completely.    Objective:   Physical Exam  Vitals reviewed. Constitutional: She is oriented to person, place, and time. She appears well-developed and well-nourished.  Pleasant, morbidly obese female.  HENT:  Head: Normocephalic and atraumatic.  Right Ear: External ear normal.  Left Ear: External ear normal.  Nose: Nose normal.  Mouth/Throat: Oropharynx is clear and moist. No oropharyngeal exudate.  Eyes: Conjunctivae are normal. Right eye exhibits no discharge. Left eye exhibits no discharge.  Neck: Normal range of motion. Neck supple.  Cardiovascular: Normal rate, regular rhythm and normal heart sounds.   Pulmonary/Chest: Effort normal and breath sounds normal. She exhibits no tenderness.  Musculoskeletal: Normal range of motion.  Lymphadenopathy:    She has no cervical adenopathy.  Neurological: She is alert and oriented to person, place, and time.  Skin: Skin is warm and dry.    Psychiatric: She has a normal mood and affect. Her behavior is normal. Judgment and thought content normal.   Results for orders placed in visit on 11/26/13  POCT CBC      Result Value Ref Range   WBC 9.2  4.6 - 10.2 K/uL   Lymph, poc 1.9  0.6 - 3.4   POC LYMPH PERCENT 20.4  10 - 50 %L   MID (cbc) 0.5  0 - 0.9   POC MID % 5.6  0 - 12 %M   POC Granulocyte 6.8  2 - 6.9   Granulocyte percent 74.0  37 - 80 %G   RBC 4.17  4.04 - 5.48 M/uL   Hemoglobin 10.0 (*) 12.2 - 16.2 g/dL   HCT, POC 32.5 (*) 37.7 - 47.9 %   MCV 78.0 (*) 80 - 97 fL   MCH, POC 24.0 (*) 27 - 31.2 pg   MCHC 30.8 (*) 31.8 - 35.4 g/dL   RDW, POC 16.6     Platelet Count, POC 321  142 - 424 K/uL   MPV 8.9  0 - 99.8 fL  POCT GLYCOSYLATED HEMOGLOBIN (HGB A1C)      Result Value Ref Range   Hemoglobin A1C 5.6    GLUCOSE, POCT (MANUAL RESULT ENTRY)      Result Value Ref Range   POC Glucose 110 (*) 70 - 99 mg/dl   EKG- reviewed by Dr. Carlota Raspberry- NSR, no change compared to previous tracing, no acute findings    Assessment & Plan:  1. Chest pain - POCT CBC - Comprehensive metabolic panel -  EKG 12-Lead  2. Diabetes - POCT glycosylated hemoglobin (Hb A1C) - POCT glucose (manual entry) - Comprehensive metabolic panel - Microalbumin, urine  3. Menorrhagia - POCT CBC - anemia- patient wishes referral to gyn to discuss uterine ablation therapy 4. HTN (hypertension) - Comprehensive metabolic panel  5. Myalgia - ibuprofen (ADVIL,MOTRIN) tablet 600 mg; Take 3 tablets (600 mg total) by mouth once.  -patient has an appointment with Dr. Joseph Art next week, she will keep this appointment to follow up symptoms. -She is to return if worsening symptoms  Elby Beck, FNP-BC  [facility]  11/26/2013 9:39 PM

## 2013-11-27 LAB — COMPREHENSIVE METABOLIC PANEL
ALBUMIN: 3.6 g/dL (ref 3.5–5.2)
ALT: 27 U/L (ref 0–35)
AST: 19 U/L (ref 0–37)
Alkaline Phosphatase: 82 U/L (ref 39–117)
BUN: 16 mg/dL (ref 6–23)
CALCIUM: 8.9 mg/dL (ref 8.4–10.5)
CHLORIDE: 102 meq/L (ref 96–112)
CO2: 26 mEq/L (ref 19–32)
Creat: 0.67 mg/dL (ref 0.50–1.10)
Glucose, Bld: 91 mg/dL (ref 70–99)
POTASSIUM: 3.5 meq/L (ref 3.5–5.3)
SODIUM: 138 meq/L (ref 135–145)
TOTAL PROTEIN: 7.1 g/dL (ref 6.0–8.3)
Total Bilirubin: 0.3 mg/dL (ref 0.2–1.2)

## 2013-11-27 LAB — MICROALBUMIN, URINE: Microalb, Ur: 6.31 mg/dL — ABNORMAL HIGH (ref 0.00–1.89)

## 2013-11-27 NOTE — Progress Notes (Signed)
EKG read and patient discussed with April Duncan. Agree with assessment and plan of care per her note.

## 2013-12-04 ENCOUNTER — Ambulatory Visit (INDEPENDENT_AMBULATORY_CARE_PROVIDER_SITE_OTHER): Payer: BC Managed Care – PPO | Admitting: Family Medicine

## 2013-12-04 ENCOUNTER — Encounter: Payer: Self-pay | Admitting: Family Medicine

## 2013-12-04 VITALS — BP 138/100 | HR 77 | Temp 98.2°F | Resp 16 | Ht 64.0 in | Wt 287.2 lb

## 2013-12-04 DIAGNOSIS — M25559 Pain in unspecified hip: Secondary | ICD-10-CM

## 2013-12-04 DIAGNOSIS — M25551 Pain in right hip: Secondary | ICD-10-CM

## 2013-12-04 MED ORDER — MELOXICAM 15 MG PO TABS: 15.0000 mg | ORAL_TABLET | Freq: Every day | ORAL | Status: DC
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
## 2013-12-04 NOTE — Patient Instructions (Signed)
[name], 2:30 with [name]  [phone])  April Duncan across from [employer].

## 2013-12-26 ENCOUNTER — Ambulatory Visit: Payer: BC Managed Care – PPO | Admitting: Women's Health

## 2014-02-11 ENCOUNTER — Telehealth: Payer: Self-pay

## 2014-02-11 NOTE — Telephone Encounter (Signed)
Clld pt - [facility] on cell to schedule follow up Diabetes mgmt appt with Dr. Everlene Farrier.

## 2014-02-21 ENCOUNTER — Ambulatory Visit (INDEPENDENT_AMBULATORY_CARE_PROVIDER_SITE_OTHER): Payer: BC Managed Care – PPO | Admitting: Emergency Medicine

## 2014-02-21 ENCOUNTER — Ambulatory Visit: Payer: Self-pay

## 2014-02-21 ENCOUNTER — Ambulatory Visit (INDEPENDENT_AMBULATORY_CARE_PROVIDER_SITE_OTHER): Payer: BC Managed Care – PPO

## 2014-02-21 VITALS — BP 132/82 | HR 81 | Temp 98.4°F | Resp 16 | Ht 64.0 in | Wt 293.0 lb

## 2014-02-21 DIAGNOSIS — S6991XA Unspecified injury of right wrist, hand and finger(s), initial encounter: Secondary | ICD-10-CM

## 2014-02-21 DIAGNOSIS — S63509A Unspecified sprain of unspecified wrist, initial encounter: Secondary | ICD-10-CM

## 2014-02-21 DIAGNOSIS — S59909A Unspecified injury of unspecified elbow, initial encounter: Secondary | ICD-10-CM

## 2014-02-21 DIAGNOSIS — S59919A Unspecified injury of unspecified forearm, initial encounter: Secondary | ICD-10-CM

## 2014-02-21 DIAGNOSIS — S6990XA Unspecified injury of unspecified wrist, hand and finger(s), initial encounter: Secondary | ICD-10-CM

## 2014-02-21 MED ORDER — ACETAMINOPHEN-CODEINE #3 300-30 MG PO TABS: 1.0000 | ORAL_TABLET | ORAL | Status: DC | PRN

## 2014-02-21 NOTE — Patient Instructions (Signed)
Wrist Pain Wrist injuries are frequent in adults and children. A sprain is an injury to the ligaments that hold your bones together. A strain is an injury to muscle or muscle cord-like structures (tendons) from stretching or pulling. Generally, when wrists are moderately tender to touch following a fall or injury, a break in the bone (fracture) may be present. Most wrist sprains or strains are better in 3 to 5 days, but complete healing may take several weeks. HOME CARE INSTRUCTIONS   Put ice on the injured area.  Put ice in a plastic bag.  Place a towel between your skin and the bag.  Leave the ice on for 15-20 minutes, 3-4 times a day, for the first 2 days, or as directed by your health care provider.  Keep your arm raised above the level of your heart whenever possible to reduce swelling and pain.  Rest the injured area for at least 48 hours or as directed by your health care provider.  If a splint or elastic bandage has been applied, use it for as long as directed by your health care provider or until seen by a health care provider for a follow-up exam.  Only take over-the-counter or prescription medicines for pain, discomfort, or fever as directed by your health care provider.  Keep all follow-up appointments. You may need to follow up with a specialist or have follow-up X-rays. Improvement in pain level is not a guarantee that you did not fracture a bone in your wrist. The only way to determine whether or not you have a broken bone is by X-ray. SEEK IMMEDIATE MEDICAL CARE IF:   Your fingers are swollen, very red, white, or cold and blue.  Your fingers are numb or tingling.  You have increasing pain.  You have difficulty moving your fingers. MAKE SURE YOU:   Understand these instructions.  Will watch your condition.  Will get help right away if you are not doing well or get worse. Document Released: 04/05/2005 Document Revised: 07/01/2013 Document Reviewed:  08/17/2010 [facility] Patient Information 2015 [place], [place]. This information is not intended to replace advice given to you by your health care provider. Make sure you discuss any questions you have with your health care provider.

## 2014-02-21 NOTE — Progress Notes (Signed)
[facility] [address] [place] [address] [phone]  Date:  02/21/2014   Name:  April Duncan   DOB:  [birth year]   MRN:  [id number]  PCP:  Jenny Reichmann, MD    Chief Complaint: Hand Pain   History of Present Illness:  April Duncan is a 38 y.o. very pleasant female patient who presents with the following:  Was roller skating and fell, landing on her outstretched right hand.  Has pain in the base of the hand on the palmar surface.  Pain with movement of the wrist.   No improvement with over the counter medications or other home remedies. Denies other complaint or health concern today.   Patient Active Problem List   Diagnosis Date Noted  . Morbid obesity with BMI of 50.0-59.9, adult 03/12/2013  . Diabetes mellitus 08/19/2011  . Hypertension 08/19/2011  . PCOS (polycystic ovarian syndrome) 08/19/2011  . 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 Diabetes mellitus   . Hypertension   . PCOS (polycystic ovarian syndrome)   . Allergy   . Asthma   . Anemia     Past Surgical History  Procedure Laterality Date  . Gallbladder surgery    . Hernia repair    . Tubal ligation    . Cholecystectomy      History  Substance Use Topics  . Smoking status: Never Smoker   . Smokeless tobacco: Not on file  . Alcohol Use: 0.5 oz/week    1 drink(s) per week    Family History  Problem Relation Age of Onset  . Hypertension Mother   . Depression Father   . Depression Brother   . Hypertension Maternal Grandmother     Not on File  Medication list has been reviewed and updated.  Current Outpatient Prescriptions on File Prior to Visit  Medication Sig Dispense Refill  . albuterol (PROVENTIL HFA;VENTOLIN HFA) 108 (90 BASE) MCG/ACT inhaler Inhale 2 puffs into the lungs every 6 (six) hours as needed for wheezing.  1 Inhaler  4  . beclomethasone (QVAR) 80  MCG/ACT inhaler Inhale 2 puffs into the lungs 2 (two) times daily.  1 Inhaler  12  . fexofenadine (ALLEGRA) 180 MG tablet Take 1 tablet (180 mg total) by mouth daily.  30 tablet  11  . fluticasone (FLONASE) 50 MCG/ACT nasal spray Place 2 sprays into both nostrils daily.  16 g  12  . glucose blood test strip Use as instructed  100 each  12  . lisinopril-hydrochlorothiazide (PRINZIDE,ZESTORETIC) 20-12.5 MG per tablet Take 1 tablet by mouth daily. Need office visit for additional refills.  90 tablet  3  . meloxicam (MOBIC) 15 MG tablet Take 1 tablet (15 mg total) by mouth daily.  30 tablet  0  . metoprolol succinate (TOPROL-XL) 100 MG 24 hr tablet Take 1 tablet (100 mg total) by mouth daily. Take with or immediately following a meal.  90 tablet  3  . pseudoephedrine-guaifenesin (MUCINEX D) 60-600 MG per tablet Take 1 tablet by mouth every 12 (twelve) hours.  18 tablet  0  . silver sulfADIAZINE (SILVADENE) 1 % cream Apply 1 application topically daily.  400 g  1  . sitaGLIPtin-metformin (JANUMET) 50-1000 MG per tablet Take 1 tablet by mouth 2 (two) times daily with a meal.  60 tablet  5  . triamcinolone cream (KENALOG) 0.1 % Apply topically  2 (two) times daily. Apply to dry skin on face  60 g  4   No current facility-administered medications on file prior to visit.    Review of Systems:  As per HPI, otherwise negative.    Physical Examination: Filed Vitals:   02/21/14 1645  BP: 132/82  Pulse: 81  Temp: 98.4 F (36.9 C)  Resp: 16   Filed Vitals:   02/21/14 1645  Height: 5' 4"  (1.626 m)  Weight: 293 lb (132.904 kg)   Body mass index is 50.27 kg/(m^2). Ideal Body Weight: Weight in (lb) to have BMI = 25: 145.3   GEN: WDWN, NAD, Non-toxic, Alert & Oriented x 3 HEENT: Atraumatic, Normocephalic.  Ears and Nose: No external deformity. EXTR: No clubbing/cyanosis/edema NEURO: Normal gait.  PSYCH: Normally interactive. Conversant. Not depressed or anxious appearing.  Calm demeanor.   RIGHT hand;  Tender palm at base of hand.  No ecchymosis or deformity RIGHT wrist:  Tender and guards wrist.  Assessment and Plan: Navicular injury Splint tyl #3  Signed,  Ellison Carwin, MD  [facility] reading (PRIMARY) by  Dr. Ouida Sills.  Questionable navicular.

## 2014-03-28 ENCOUNTER — Other Ambulatory Visit: Payer: Self-pay | Admitting: Family Medicine

## 2014-05-09 ENCOUNTER — Ambulatory Visit (INDEPENDENT_AMBULATORY_CARE_PROVIDER_SITE_OTHER): Payer: BC Managed Care – PPO

## 2014-05-09 ENCOUNTER — Ambulatory Visit (INDEPENDENT_AMBULATORY_CARE_PROVIDER_SITE_OTHER): Payer: BC Managed Care – PPO | Admitting: Emergency Medicine

## 2014-05-09 VITALS — BP 160/116 | HR 79 | Temp 97.9°F | Resp 18 | Ht 65.0 in | Wt 288.0 lb

## 2014-05-09 DIAGNOSIS — M25519 Pain in unspecified shoulder: Secondary | ICD-10-CM

## 2014-05-09 DIAGNOSIS — I10 Essential (primary) hypertension: Secondary | ICD-10-CM

## 2014-05-09 DIAGNOSIS — M542 Cervicalgia: Secondary | ICD-10-CM

## 2014-05-09 DIAGNOSIS — N939 Abnormal uterine and vaginal bleeding, unspecified: Secondary | ICD-10-CM

## 2014-05-09 DIAGNOSIS — Z23 Encounter for immunization: Secondary | ICD-10-CM

## 2014-05-09 DIAGNOSIS — E119 Type 2 diabetes mellitus without complications: Secondary | ICD-10-CM

## 2014-05-09 LAB — POCT CBC
Granulocyte percent: 66.4 %G (ref 37–80)
HCT, POC: 38.2 % (ref 37.7–47.9)
HEMOGLOBIN: 12.2 g/dL (ref 12.2–16.2)
Lymph, poc: 2.7 (ref 0.6–3.4)
MCH: 26.1 pg — AB (ref 27–31.2)
MCHC: 32.1 g/dL (ref 31.8–35.4)
MCV: 81.5 fL (ref 80–97)
MID (cbc): 0.5 (ref 0–0.9)
MPV: 7.4 fL (ref 0–99.8)
POC Granulocyte: 6.4 (ref 2–6.9)
POC LYMPH PERCENT: 27.9 %L (ref 10–50)
POC MID %: 5.7 %M (ref 0–12)
Platelet Count, POC: 377 10*3/uL (ref 142–424)
RBC: 4.68 M/uL (ref 4.04–5.48)
RDW, POC: 14.1 %
WBC: 9.6 10*3/uL (ref 4.6–10.2)

## 2014-05-09 LAB — POCT URINE PREGNANCY: Preg Test, Ur: NEGATIVE

## 2014-05-09 LAB — GLUCOSE, POCT (MANUAL RESULT ENTRY): POC Glucose: 101 mg/dl — AB (ref 70–99)

## 2014-05-09 LAB — BASIC METABOLIC PANEL
BUN: 11 mg/dL (ref 6–23)
CO2: 23 meq/L (ref 19–32)
CREATININE: 0.54 mg/dL (ref 0.50–1.10)
Calcium: 8.9 mg/dL (ref 8.4–10.5)
Chloride: 104 mEq/L (ref 96–112)
GLUCOSE: 79 mg/dL (ref 70–99)
Potassium: 3.8 mEq/L (ref 3.5–5.3)
SODIUM: 138 meq/L (ref 135–145)

## 2014-05-09 LAB — POCT GLYCOSYLATED HEMOGLOBIN (HGB A1C): HEMOGLOBIN A1C: 5.4

## 2014-05-09 MED ORDER — AMLODIPINE BESYLATE 5 MG PO TABS: 5.0000 mg | ORAL_TABLET | Freq: Every day | ORAL | Status: DC

## 2014-05-09 MED ORDER — METFORMIN HCL 1000 MG PO TABS: 1000.0000 mg | ORAL_TABLET | Freq: Two times a day (BID) | ORAL | Status: DC

## 2014-05-09 MED ORDER — METHOCARBAMOL 500 MG PO TABS: 500.0000 mg | ORAL_TABLET | Freq: Three times a day (TID) | ORAL | Status: DC | PRN

## 2014-05-09 NOTE — Progress Notes (Addendum)
This chart was scribed for Arlyss Queen, MD by Einar Pheasant, ED Scribe. This patient was seen in room 8 and the patient's care was started at 11:45 AM.  Subjective:    Patient ID: April Duncan, female    DOB: 1976-07-08, 38 y.o.   MRN: [id number]  Chief Complaint  Patient presents with  . Medication Refill    Janumet  . Shoulder Pain  . Neck Pain  . Menometrorrhagia    Since having Mirena Placed    HPI April Duncan is a 38 y.o. female with a hx of DM, HTN, PCOS.   Pt does not recall the last time she has her blood sugar checked. However, she states that in the past she has had good readings.  Pt presents to the office today with several complaints. She is complaining of bilateral shoulder pain that started a couple of weeks ago. Pt is also complaining of associated neck pain that extends down to bilateral shoulder. She denies any prior hx of neck pain. However, she does endorse sitting at the computer for extended hours. Pt states that the neck pain is worsened by the the turning of her neck.  She states that she has also been experiencing menometrorrhagia ever since she had Mirena placed. Pt states that the IUD was placed approximately 1 month ago by Dr. Lisbeth Renshaw. She states that the IUD to control her heavy bleeding. Pt states that she has had vaginal bleeding for the past 3 weeks, using about 2 pads daily. April Duncan reports associated vaginal itching.   April Duncan is requesting a refill on her Janumet.   Patient Active Problem List   Diagnosis Date Noted  . Morbid obesity with BMI of 50.0-59.9, adult 03/12/2013  . Diabetes mellitus 08/19/2011  . Hypertension 08/19/2011  . PCOS (polycystic ovarian syndrome) 08/19/2011  . 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 Diabetes mellitus   . Hypertension   . PCOS (polycystic ovarian syndrome)   . Allergy   . Asthma   . Anemia    Past  Surgical History  Procedure Laterality Date  . Gallbladder surgery    . Hernia repair    . Tubal ligation    . Cholecystectomy     No Known Allergies Prior to Admission medications   Medication Sig Start Date End Date Taking? Authorizing Provider  albuterol (PROVENTIL HFA;VENTOLIN HFA) 108 (90 BASE) MCG/ACT inhaler Inhale 2 puffs into the lungs every 6 (six) hours as needed for wheezing. 07/31/13 07/31/14 Yes Robyn Haber, MD  beclomethasone (QVAR) 80 MCG/ACT inhaler Inhale 2 puffs into the lungs 2 (two) times daily. 07/31/13 07/31/14 Yes Robyn Haber, MD  fexofenadine (ALLEGRA) 180 MG tablet Take 1 tablet (180 mg total) by mouth daily. 03/12/13  Yes Duane Boston, MD  fluticasone (FLONASE) 50 MCG/ACT nasal spray Place 2 sprays into both nostrils daily. 07/31/13  Yes Robyn Haber, MD  glucose blood test strip Use as instructed 03/12/13  Yes Duane Boston, MD  JANUMET 50-1000 MG per tablet TAKE ONE TABLET BY MOUTH TWICE DAILY WITH A MEAL 03/31/14  Yes Mancel Bale, PA-C  lisinopril-hydrochlorothiazide (PRINZIDE,ZESTORETIC) 20-12.5 MG per tablet Take 1 tablet by mouth daily. Need office visit for additional refills. 07/31/13  Yes Robyn Haber, MD  metoprolol succinate (TOPROL-XL) 100 MG 24 hr tablet Take 1 tablet (100 mg total) by mouth daily. Take with or immediately following a meal. 07/31/13  Yes Robyn Haber, MD  silver sulfADIAZINE (SILVADENE) 1 % cream Apply 1 application topically daily. 07/31/13  Yes Robyn Haber, MD  triamcinolone cream (KENALOG) 0.1 % Apply topically 2 (two) times daily. Apply to dry skin on face 07/31/13  Yes Robyn Haber, MD  acetaminophen-codeine (TYLENOL #3) 300-30 MG per tablet Take 1-2 tablets by mouth every 4 (four) hours as needed. 02/21/14   Roselee Culver, MD  meloxicam (MOBIC) 15 MG tablet Take 1 tablet (15 mg total) by mouth daily. 12/04/13   Robyn Haber, MD  pseudoephedrine-guaifenesin [facility] D) 60-600 MG per tablet Take 1 tablet by mouth every  12 (twelve) hours. 05/22/13 05/22/14  Roselee Culver, MD   History   Social History  . Marital Status: Married    Spouse Name: N/A    Number of Children: N/A  . Years of Education: N/A   Occupational History  . Not on file.   Social History Main Topics  . Smoking status: Never Smoker   . Smokeless tobacco: Never Used  . Alcohol Use: 0.5 oz/week    1 drink(s) per week  . Drug Use: No  . Sexual Activity: Yes    Birth Control/ Protection: None   Other Topics Concern  . Not on file   Social History Narrative  . No narrative on file    Review of Systems  Constitutional: Negative for appetite change and fatigue.  HENT: Negative for congestion, ear discharge and sinus pressure.   Eyes: Negative for discharge.  Respiratory: Negative for cough.   Gastrointestinal: Negative for abdominal pain and diarrhea.  Genitourinary: Positive for vaginal bleeding. Negative for frequency and hematuria.  Musculoskeletal: Positive for arthralgias and neck pain. Negative for back pain.  Skin: Negative for rash.  Neurological: Negative for seizures.  Psychiatric/Behavioral: Negative for hallucinations.       Objective:   Physical Exam  Nursing note and vitals reviewed. Constitutional: She appears well-developed and well-nourished. No distress.  HENT:  Head: Normocephalic and atraumatic.  Eyes: Conjunctivae are normal. Right eye exhibits no discharge. Left eye exhibits no discharge.  Neck: Neck supple.  Cardiovascular: Normal rate, regular rhythm and normal heart sounds.  Exam reveals no gallop and no friction rub.   No murmur heard. BP was rechecked during PE: 150/90  Pulmonary/Chest: Effort normal and breath sounds normal. No respiratory distress.  Abdominal: Soft. She exhibits no distension. There is no tenderness.  Musculoskeletal: She exhibits no edema and no tenderness.  Left side of the neck is tender to palpation. Decreased ROM noted with turning to the right and the left.     Neurological: She is alert.  Skin: Skin is warm and dry.  Psychiatric: She has a normal mood and affect. Her behavior is normal. Thought content normal.    Filed Vitals:   05/09/14 1039  BP: 167/111  Pulse: 86  Temp: 97.9 F (36.6 C)  TempSrc: Oral  Resp: 18  Height: 5' 5"  (1.651 m)  Weight: 288 lb (130.636 kg)  SpO2: 96%   Results for orders placed in visit on 05/09/14  POCT CBC      Result Value Ref Range   WBC 9.6  4.6 - 10.2 K/uL   Lymph, poc 2.7  0.6 - 3.4   POC LYMPH PERCENT 27.9  10 - 50 %L   MID (cbc) 0.5  0 - 0.9   POC MID % 5.7  0 - 12 %M   POC Granulocyte 6.4  2 - 6.9   Granulocyte percent  66.4  37 - 80 %G   RBC 4.68  4.04 - 5.48 M/uL   Hemoglobin 12.2  12.2 - 16.2 g/dL   HCT, POC 38.2  37.7 - 47.9 %   MCV 81.5  80 - 97 fL   MCH, POC 26.1 (*) 27 - 31.2 pg   MCHC 32.1  31.8 - 35.4 g/dL   RDW, POC 14.1     Platelet Count, POC 377  142 - 424 K/uL   MPV 7.4  0 - 99.8 fL  GLUCOSE, POCT (MANUAL RESULT ENTRY)      Result Value Ref Range   POC Glucose 101 (*) 70 - 99 mg/dl  POCT URINE PREGNANCY      Result Value Ref Range   Preg Test, Ur Negative    POCT GLYCOSYLATED HEMOGLOBIN (HGB A1C)      Result Value Ref Range   Hemoglobin A1C 5.4    [facility] reading (PRIMARY) by  Dr.Daub no fractures are seen. There is normal alignment. Meds ordered this encounter  Medications  . metFORMIN (GLUCOPHAGE) 1000 MG tablet    Sig: Take 1 tablet (1,000 mg total) by mouth 2 (two) times daily with a meal.    Dispense:  180 tablet    Refill:  3  . amLODipine (NORVASC) 5 MG tablet    Sig: Take 1 tablet (5 mg total) by mouth daily.    Dispense:  90 tablet    Refill:  3  . methocarbamol (ROBAXIN) 500 MG tablet    Sig: Take 1 tablet (500 mg total) by mouth every 8 (eight) hours as needed for muscle spasms.    Dispense:  40 tablet    Refill:  0  [facility] reading (PRIMARY) by  Dr. Everlene Farrier C-spine films are normal   Assessment & Plan:  Pressure was up today. We'll add amlodipine 5 mg 1  a day to her current regimen. Recheck blood pressure 2 weeks. I stopped her Janumet and changed her to plain metformin since her hemoglobin A1c was 5.4. She will follow-up with the OB/GYN on Monday to see about having her Mirena removed. Her hemoglobin is stable.  I personally performed the services described in this documentation, which was scribed in my presence. The recorded information has been reviewed and is accurate.

## 2014-05-09 NOTE — Patient Instructions (Signed)
Influenza Vaccine (Flu Vaccine, Inactivated or Recombinant) 2014-2015: What You Need to Know 1. Why get vaccinated? Influenza ("flu") is a contagious disease that spreads around the [place] every winter, usually between October and May. Flu is caused by influenza viruses, and is spread mainly by coughing, sneezing, and close contact. Anyone can get flu, but the risk of getting flu is highest among children. Symptoms come on suddenly and may last several days. They can include:  fever/chills  sore throat  muscle aches  fatigue  cough  headache  runny or stuffy nose Flu can make some people much sicker than others. These people include young children, people 65 and older, pregnant women, and people with certain health conditions-such as heart, lung or kidney disease, nervous system disorders, or a weakened immune system. Flu vaccination is especially important for these people, and anyone in close contact with them. Flu can also lead to pneumonia, and make existing medical conditions worse. It can cause diarrhea and seizures in children. Each year thousands of people in the [place] die from flu, and many more are hospitalized. Flu vaccine is the best protection against flu and its complications. Flu vaccine also helps prevent spreading flu from person to person. 2. Inactivated and recombinant flu vaccines You are getting an injectable flu vaccine, which is either an "inactivated" or "recombinant" vaccine. These vaccines do not contain any live influenza virus. They are given by injection with a needle, and often called the "flu shot."  A different live, attenuated (weakened) influenza vaccine is sprayed into the nostrils. This vaccine is described in a separate Vaccine Information Statement. Flu vaccination is recommended every year. Some children 6 months through 8 years of age might need two doses during one year. Flu viruses are always changing. Each year's flu vaccine is made  to protect against 3 or 4 viruses that are likely to cause disease that year. Flu vaccine cannot prevent all cases of flu, but it is the best defense against the disease.  It takes about 2 weeks for protection to develop after the vaccination, and protection lasts several months to a year. Some illnesses that are not caused by influenza virus are often mistaken for flu. Flu vaccine will not prevent these illnesses. It can only prevent influenza. Some inactivated flu vaccine contains a very small amount of a mercury-based preservative called thimerosal. Studies have shown that thimerosal in vaccines is not harmful, but flu vaccines that do not contain a preservative are available. 3. Some people should not get this vaccine Tell the person who gives you the vaccine:  If you have any severe, life-threatening allergies. If you ever had a life-threatening allergic reaction after a dose of flu vaccine, or have a severe allergy to any part of this vaccine, including (for example) an allergy to gelatin, antibiotics, or eggs, you may be advised not to get vaccinated. Most, but not all, types of flu vaccine contain a small amount of egg protein.  If you ever had Guillain-Barr Syndrome (a severe paralyzing illness, also called GBS). Some people with a history of GBS should not get this vaccine. This should be discussed with your doctor.  If you are not feeling well. It is usually okay to get flu vaccine when you have a mild illness, but you might be advised to wait until you feel better. You should come back when you are better. 4. Risks of a vaccine reaction With a vaccine, like any medicine, there is a chance of side   effects. These are usually mild and go away on their own. Problems that could happen after any vaccine:  Brief fainting spells can happen after any medical procedure, including vaccination. Sitting or lying down for about 15 minutes can help prevent fainting, and injuries caused by a fall. Tell  your doctor if you feel dizzy, or have vision changes or ringing in the ears.  Severe shoulder pain and reduced range of motion in the arm where a shot was given can happen, very rarely, after a vaccination.  Severe allergic reactions from a vaccine are very rare, estimated at less than 1 in a million doses. If one were to occur, it would usually be within a few minutes to a few hours after the vaccination. Mild problems following inactivated flu vaccine:  soreness, redness, or swelling where the shot was given  hoarseness  sore, red or itchy eyes  cough  fever  aches  headache  itching  fatigue If these problems occur, they usually begin soon after the shot and last 1 or 2 days. Moderate problems following inactivated flu vaccine:  Young children who get inactivated flu vaccine and pneumococcal vaccine (PCV13) at the same time may be at increased risk for seizures caused by fever. Ask your doctor for more information. Tell your doctor if a child who is getting flu vaccine has ever had a seizure. Inactivated flu vaccine does not contain live flu virus, so you cannot get the flu from this vaccine. As with any medicine, there is a very remote chance of a vaccine causing a serious injury or death. The safety of vaccines is always being monitored. For more information, visit: www.cdc.gov/vaccinesafety/ 5. What if there is a serious reaction? What should I look for?  Look for anything that concerns you, such as signs of a severe allergic reaction, very high fever, or behavior changes. Signs of a severe allergic reaction can include hives, swelling of the face and throat, difficulty breathing, a fast heartbeat, dizziness, and weakness. These would start a few minutes to a few hours after the vaccination. What should I do?  If you think it is a severe allergic reaction or other emergency that can't wait, call 9-1-1 and get the person to the nearest hospital. Otherwise, call your  doctor.  Afterward, the reaction should be reported to the Vaccine Adverse Event Reporting System (VAERS). Your doctor should file this report, or you can do it yourself through the VAERS web site at www.vaers.hhs.gov, or by calling 1-800-822-7967. VAERS does not give medical advice. 6. The National Vaccine Injury Compensation Program The National Vaccine Injury Compensation Program (VICP) is a federal program that was created to compensate people who may have been injured by certain vaccines. Persons who believe they may have been injured by a vaccine can learn about the program and about filing a claim by calling 1-800-338-2382 or visiting the VICP website at www.hrsa.gov/vaccinecompensation. There is a time limit to file a claim for compensation. 7. How can I learn more?  Ask your health care provider.  Call your local or state health department.  Contact the Centers for Disease Control and Prevention (CDC):  Call 1-800-232-4636 (1-800-CDC-INFO) or  Visit CDC's website at www.cdc.gov/flu CDC Vaccine Information Statement (Interim) Inactivated Influenza Vaccine (02/25/2013) Document Released: 04/20/2006 Document Revised: 11/10/2013 Document Reviewed: 06/13/2013 [employer] Patient Information 2015 [employer]. This information is not intended to replace advice given to you by your health care provider. Make sure you discuss any questions you have with your health   care provider.  

## 2014-07-01 ENCOUNTER — Encounter: Payer: Self-pay | Admitting: Podiatrist

## 2014-07-01 ENCOUNTER — Ambulatory Visit (INDEPENDENT_AMBULATORY_CARE_PROVIDER_SITE_OTHER): Payer: BC Managed Care – PPO | Admitting: Podiatrist

## 2014-07-01 VITALS — BP 155/98 | HR 77 | Resp 16

## 2014-07-01 DIAGNOSIS — M9262 Juvenile osteochondrosis of tarsus, left ankle: Secondary | ICD-10-CM

## 2014-07-01 DIAGNOSIS — M779 Enthesopathy, unspecified: Secondary | ICD-10-CM

## 2014-07-01 MED ORDER — METHYLPREDNISOLONE (PAK) 4 MG PO TABS: ORAL_TABLET | ORAL | Status: DC

## 2014-07-01 MED ORDER — DICLOFENAC SODIUM 75 MG PO TBEC: 75.0000 mg | DELAYED_RELEASE_TABLET | Freq: Two times a day (BID) | ORAL | Status: DC

## 2014-07-01 NOTE — Progress Notes (Signed)
Chief Complaint  Patient presents with  . Foot Pain    Follow up achilles tendonitis left   "Its hurting again."     HPI: Patient is 38 y.o. female who presents today for achilles tendonitis on the left heel-- she walked around an indoor track for 2 miles yesterday and states the heel started giving her severe pains. She has treated it with ice.    No Known Allergies  Physical Exam  Patient is awake, alert, and oriented x 3.  In no acute distress.  Vascular status is intact with palpable pedal pulses at 2/4 DP and PT bilateral and capillary refill time within normal limits. Neurological sensation is also intact bilaterally via Semmes Weinstein monofilament at 5/5 sites. Light touch, vibratory sensation, Achilles tendon reflex is intact. Dermatological exam reveals skin color, turger and texture as normal. No open lesions present.  Musculature intact with dorsiflexion, plantarflexion, inversion, eversion. Pinpoint pain on palpation on the posterior aspect of the left heel at the insertion of the achilles tendon onto the calcaneus is noted.  Pain within the tendon itself if palpated to be painful.   Assessment: achilles tendonitis left   Plan: she already started wearing her short air fracture walker and is instructed to keep wearing it until her next visit.  Called in a medrol dose pack and diclofenac to be taken.  Will consider pt or injection is there is no improvement at the next visit in 2-3 weeks.

## 2014-07-01 NOTE — Patient Instructions (Signed)
Achilles Tendinitis Achilles tendinitis is inflammation of the tough, cord-like band that attaches the lower muscles of your leg to your heel (Achilles tendon). It is usually caused by overusing the tendon and joint involved.  CAUSES Achilles tendinitis can happen because of:  A sudden increase in exercise or activity (such as running).  Doing the same exercises or activities (such as jumping) over and over.  Not warming up calf muscles before exercising.  Exercising in shoes that are worn out or not made for exercise.  Having arthritis or a bone growth on the back of the heel bone. This can rub against the tendon and hurt the tendon. SIGNS AND SYMPTOMS The most common symptoms are:  Pain in the back of the leg, just above the heel. The pain usually gets worse with exercise and better with rest.  Stiffness or soreness in the back of the leg, especially in the morning.  Swelling of the skin over the Achilles tendon.  Trouble standing on tiptoe. Sometimes, an Achilles tendon tears (ruptures). Symptoms of an Achilles tendon rupture can include:  Sudden, severe pain in the back of the leg.  Trouble putting weight on the foot or walking normally. DIAGNOSIS Achilles tendinitis will be diagnosed based on symptoms and a physical examination. An X-ray may be done to check if another condition is causing your symptoms. An MRI may be ordered if your health care provider suspects you may have completely torn your tendon, which is called an Achilles tendon rupture.  TREATMENT  Achilles tendinitis usually gets better over time. It can take weeks to months to heal completely. Treatment focuses on treating the symptoms and helping the injury heal. HOME CARE INSTRUCTIONS   Rest your Achilles tendon and avoid activities that cause pain.  Apply ice to the injured area:  Put ice in a plastic bag.  Place a towel between your skin and the bag.  Leave the ice on for 20 minutes, 2-3 times a  day  Try to avoid using the tendon (other than gentle range of motion) while the tendon is painful. Do not resume use until instructed by your health care provider. Then begin use gradually. Do not increase use to the point of pain. If pain does develop, decrease use and continue the above measures. Gradually increase activities that do not cause discomfort until you achieve normal use.  Do exercises to make your calf muscles stronger and more flexible. Your health care provider or physical therapist can recommend exercises for you to do.  Wrap your ankle with an elastic bandage or other wrap. This can help keep your tendon from moving too much. Your health care provider will show you how to wrap your ankle correctly.  Only take over-the-counter or prescription medicines for pain, discomfort, or fever as directed by your health care provider. SEEK MEDICAL CARE IF:   Your pain and swelling increase or pain is uncontrolled with medicines.  You develop new, unexplained symptoms or your symptoms get worse.  You are unable to move your toes or foot.  You develop warmth and swelling in your foot.  You have an unexplained temperature. MAKE SURE YOU:   Understand these instructions.  Will watch your condition.  Will get help right away if you are not doing well or get worse. Document Released: 04/05/2005 Document Revised: 04/16/2013 Document Reviewed: 02/05/2013 [facility] Patient Information 2015 [place], [place]. This information is not intended to replace advice given to you by your health care provider. Make sure you discuss  any questions you have with your health care provider.  

## 2014-07-24 ENCOUNTER — Ambulatory Visit: Payer: BC Managed Care – PPO | Admitting: Podiatrist

## 2014-07-31 ENCOUNTER — Ambulatory Visit: Payer: BC Managed Care – PPO | Admitting: Podiatrist

## 2014-08-19 ENCOUNTER — Ambulatory Visit: Payer: BC Managed Care – PPO | Admitting: Podiatrist

## 2014-10-09 ENCOUNTER — Other Ambulatory Visit: Payer: Self-pay | Admitting: Family Medicine

## 2014-10-09 ENCOUNTER — Ambulatory Visit (INDEPENDENT_AMBULATORY_CARE_PROVIDER_SITE_OTHER): Payer: BC Managed Care – PPO | Admitting: Family Medicine

## 2014-10-09 VITALS — BP 145/93 | HR 95 | Temp 98.4°F | Resp 16 | Ht 64.0 in | Wt 294.0 lb

## 2014-10-09 DIAGNOSIS — M7661 Achilles tendinitis, right leg: Secondary | ICD-10-CM

## 2014-10-09 DIAGNOSIS — I10 Essential (primary) hypertension: Secondary | ICD-10-CM

## 2014-10-09 DIAGNOSIS — M7662 Achilles tendinitis, left leg: Secondary | ICD-10-CM | POA: Diagnosis not present

## 2014-10-09 MED ORDER — CHLORTHALIDONE 25 MG PO TABS: 25.0000 mg | ORAL_TABLET | Freq: Every day | ORAL | Status: DC

## 2014-10-09 MED ORDER — METOPROLOL SUCCINATE ER 100 MG PO TB24: 100.0000 mg | ORAL_TABLET | Freq: Every day | ORAL | Status: DC

## 2014-10-09 MED ORDER — LISINOPRIL 40 MG PO TABS: 40.0000 mg | ORAL_TABLET | Freq: Every day | ORAL | Status: DC

## 2014-10-09 NOTE — Progress Notes (Signed)
This chart was scribed for Dr. Robyn Haber, MD by Erling Conte, Medical Scribe. This patient was seen in Room 2 and the patient's care was started at [place] PM.  Patient ID: April Duncan MRN: [id number], DOB: [birth year], 39 y.o. Date of Encounter: 10/09/2014, 6:14 PM  Primary Physician: Jenny Reichmann, MD  Chief Complaint:  Chief Complaint  Patient presents with  . Foot Pain    left foot   . Medication Refill    Lisinopril  . Leg Swelling    bilateral x 1 week     HPI: 39 y.o. year old female with history below presents for refill of her lisinopril and metoprolol medication Doing well without issues or complaints. Taking medication daily without adverse effects.  She is having swelling of bilateral legs and ankles for 2 weeks. Pt notes she used an herb patch on her legs hoping it would improve the symptoms but it made it worse. She states they have started to gradually improve but there is still some mild swelling.   She is also having left foot pain.She notes she is on her feet a lot due to her job. She notes she has gone to physical therapy in the past for this problem as well as an orthopedist with no relief.  Pt works as Personnel officer education  Past Medical History  Diagnosis Date  . 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 Diabetes mellitus   . Hypertension   . PCOS (polycystic ovarian syndrome)   . Allergy   . Asthma   . Anemia      Home Meds: Prior to Admission medications   Medication Sig Start Date End Date Taking? Authorizing Provider  amLODipine (NORVASC) 5 MG tablet Take 1 tablet (5 mg total) by mouth daily. 05/09/14  Yes Darlyne Russian, MD  fexofenadine (ALLEGRA) 180 MG tablet Take 1 tablet (180 mg total) by mouth daily. 03/12/13  Yes Duane Boston, MD  fluticasone (FLONASE) 50 MCG/ACT nasal spray Place 2 sprays into both nostrils daily. 07/31/13  Yes Robyn Haber, MD  glucose blood test strip Use as instructed 03/12/13  Yes Duane Boston,  MD  lisinopril-hydrochlorothiazide (PRINZIDE,ZESTORETIC) 20-12.5 MG per tablet Take 1 tablet by mouth daily. PATIENT NEEDS OFFICE VISIT FOR ADDITIONAL REFILLS 10/09/14  Yes Darlyne Russian, MD  metFORMIN (GLUCOPHAGE) 1000 MG tablet Take 1 tablet (1,000 mg total) by mouth 2 (two) times daily with a meal. 05/09/14  Yes Darlyne Russian, MD  metoprolol succinate (TOPROL-XL) 100 MG 24 hr tablet Take 1 tablet (100 mg total) by mouth daily. Take with or immediately following a meal. 07/31/13  Yes Robyn Haber, MD  silver sulfADIAZINE (SILVADENE) 1 % cream Apply 1 application topically daily. 07/31/13  Yes Robyn Haber, MD  triamcinolone cream (KENALOG) 0.1 % Apply topically 2 (two) times daily. Apply to dry skin on face 07/31/13  Yes Robyn Haber, MD  acetaminophen-codeine (TYLENOL #3) 300-30 MG per tablet Take 1-2 tablets by mouth every 4 (four) hours as needed. Patient not taking: Reported on 10/09/2014 02/21/14   Roselee Culver, MD  albuterol (PROVENTIL HFA;VENTOLIN HFA) 108 (90 BASE) MCG/ACT inhaler Inhale 2 puffs into the lungs every 6 (six) hours as needed for wheezing. 07/31/13 07/31/14  Robyn Haber, MD  beclomethasone (QVAR) 80 MCG/ACT inhaler Inhale 2 puffs into the lungs 2 (two) times daily. 07/31/13 07/31/14  Robyn Haber, MD  diclofenac (VOLTAREN) 75 MG EC tablet Take 1 tablet (75 mg total) by mouth  2 (two) times daily. Start at completion of steroid taper pack Patient not taking: Reported on 10/09/2014 07/01/14   Bronson Ing, DPM  meloxicam (MOBIC) 15 MG tablet Take 1 tablet (15 mg total) by mouth daily. Patient not taking: Reported on 10/09/2014 12/04/13   Robyn Haber, MD  methocarbamol (ROBAXIN) 500 MG tablet Take 1 tablet (500 mg total) by mouth every 8 (eight) hours as needed for muscle spasms. Patient not taking: Reported on 10/09/2014 05/09/14   Darlyne Russian, MD  methylPREDNIsolone (MEDROL DOSPACK) 4 MG tablet 6 day tapering dose-follow package instructions Patient not taking:  Reported on 10/09/2014 07/01/14   Bronson Ing, DPM    Allergies: No Known Allergies  History   Social History  . Marital Status: Married    Spouse Name: N/A  . Number of Children: N/A  . Years of Education: N/A   Occupational History  . Not on file.   Social History Main Topics  . Smoking status: Never Smoker   . Smokeless tobacco: Never Used  . Alcohol Use: 0.5 oz/week    1 drink(s) per week  . Drug Use: No  . Sexual Activity: Yes    Birth Control/ Protection: None   Other Topics Concern  . Not on file   Social History Narrative     Review of Systems: Constitutional: negative for chills, fever, night sweats, weight changes, or fatigue  HEENT: negative for vision changes, hearing loss, congestion, rhinorrhea, ST, epistaxis, or sinus pressure Cardiovascular: negative for chest pain or palpitations Respiratory: negative for hemoptysis, wheezing, shortness of breath, or cough Abdominal: negative for abdominal pain, nausea, vomiting, diarrhea, or constipation Dermatological: negative for rash Neurologic: negative for headache, dizziness, or syncope Musk: positive for arthralgias and swelling All other systems reviewed and are otherwise negative with the exception to those above and in the HPI.   Physical Exam: Blood pressure 145/93, pulse 95, temperature 98.4 F (36.9 C), temperature source Oral, resp. rate 16, height 5' 4"  (1.626 m), weight 294 lb (133.358 kg), last menstrual period 10/06/2014, SpO2 97 %., Body mass index is 50.44 kg/(m^2). General: Well developed, well nourished, in no acute distress. Head: Normocephalic, atraumatic, eyes without discharge, sclera non-icteric, nares are without discharge. Bilateral auditory canals clear, TM's are without perforation, pearly grey and translucent with reflective cone of light bilaterally. Oral cavity moist, posterior pharynx without exudate, erythema, peritonsillar abscess, or post nasal drip.  Neck: Supple. No  thyromegaly. Full ROM. No lymphadenopathy. Lungs: Clear bilaterally to auscultation without wheezes, rales, or rhonchi. Breathing is unlabored. Heart: RRR with S1 S2. No murmurs, rubs, or gallops appreciated. Good pulses.  Abdomen: Soft, non-tender, non-distended with normoactive bowel sounds. No hepatosplenomegalymegaly. No rebound/guarding. No obvious abdominal masses. Msk:  Strength and tone normal for age. Extremities/Skin: Warm and dry. No clubbing or cyanosis. No edema. No rashes or suspicious lesions. Pump bumps noted to her left achilles. Tenderness and swelling over insertion of achilles worse on the left Neuro: Alert and oriented X 3. Moves all extremities spontaneously. Gait is normal. CNII-XII grossly in tact. Psych:  Responds to questions appropriately with a normal affect.   Labs:   ASSESSMENT AND PLAN:  39 y.o. year old female with below for medication refill 1. Essential hypertension   2. Accelerated hypertension    Meds ordered this encounter  Medications  . lisinopril (PRINIVIL,ZESTRIL) 40 MG tablet    Sig: Take 1 tablet (40 mg total) by mouth daily.    Dispense:  90 tablet  Refill:  3  . chlorthalidone (HYGROTON) 25 MG tablet    Sig: Take 1 tablet (25 mg total) by mouth daily.    Dispense:  90 tablet    Refill:  3  . metoprolol succinate (TOPROL-XL) 100 MG 24 hr tablet    Sig: Take 1 tablet (100 mg total) by mouth daily. Take with or immediately following a meal.    Dispense:  90 tablet    Refill:  3   This chart was scribed in my presence and reviewed by me personally.    ICD-9-CM ICD-10-CM   1. Essential hypertension 401.9 I10 lisinopril (PRINIVIL,ZESTRIL) 40 MG tablet     chlorthalidone (HYGROTON) 25 MG tablet     metoprolol succinate (TOPROL-XL) 100 MG 24 hr tablet  2. Accelerated hypertension 401.0 I10 metoprolol succinate (TOPROL-XL) 100 MG 24 hr tablet  3. Achilles tendinitis of both lower extremities 726.71 M76.61 Ambulatory referral to Sports  Medicine    M76.62      Signed, Robyn Haber, MD   Signed, Robyn Haber, MD 10/09/2014 6:14 PM

## 2014-10-09 NOTE — Patient Instructions (Signed)
Look into the aquatic center for swimming. I'm referring her to Dr. Beatris Si fields for evaluation of the Achilles tendinitis.  Do the stretching exercises with the elastic stretch cord twice a day

## 2014-10-21 ENCOUNTER — Ambulatory Visit (INDEPENDENT_AMBULATORY_CARE_PROVIDER_SITE_OTHER): Payer: BC Managed Care – PPO | Admitting: Sports Medicine

## 2014-10-21 ENCOUNTER — Encounter: Payer: Self-pay | Admitting: Sports Medicine

## 2014-10-21 VITALS — BP 130/66 | HR 80 | Ht 64.0 in | Wt 294.0 lb

## 2014-10-21 DIAGNOSIS — M7662 Achilles tendinitis, left leg: Secondary | ICD-10-CM | POA: Insufficient documentation

## 2014-10-21 NOTE — Assessment & Plan Note (Signed)
Alfredson exercises discussed today She has a hard he tried NSAIDs will defer at this time Large scaphoid pads added to her shoes Body Helix left ankle compression sleeve provided today. Discussed using during activity as well as for the first 2 hours following activity and PRN. If she feels this is helpful she will obtain one for her right leg as well.

## 2014-10-21 NOTE — Progress Notes (Signed)
  April Duncan - 39 y.o. female MRN [id number]  Date of birth: [birth year]  SUBJECTIVE: CC: 1. Bilateral heel pain, left worse than right, initial evaluation     HPI:  Long-standing history of bilateral heel pain, left worse than right.  No known injury, recalcitrant to treatments  Has been seen by podiatry placed in rigid orthotics and given gentle stretching exercises.  No formal physical therapy.  Has tried anti-inflammatories without significant improvement.  On her feet for long.  Seems to worsen this.  No significant pain or debility upon 1st awakening.  Pain is posteriorly located     ROS:  Per HPI   HISTORY:  Past Medical, Surgical, Social, and Family History reviewed & updated per EMR.  Pertinent Historical Findings include: Social History   Occupational History  . 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 Smoking status: Never Smoker   . Smokeless tobacco: Never Used  . Alcohol Use: 0.5 oz/week    1 drink(s) per week  . Drug Use: No  . Sexual Activity: Yes    Birth Control/ Protection: None    No specialty comments available. Problem  Tendonitis, Achilles, Left   OBJECTIVE:  VS:   HT:5' 4"  (162.6 cm)   WT:294 lb (133.358 kg)  BMI:50.6          BP:130/66 mmHg  HR:80bpm  TEMP: ( )  RESP:   PHYSICAL EXAM: GENERAL: adult obese Caucasian. No acute distress PSYCH: Alert and appropriately interactive. SKIN: No open skin lesions or abnormal skin markings on areas inspected as below VASCULAR: bilateral DP and PT pulses 2+/4.  Minimal to trace pretibial edema bilaterally and symmetric.  Negative Homans NEURO: Lower extremity strength is 5+/5 in all myotomes; sensation is intact to light touch in all dermatomes. BILATERAL FEET: Marked splay toe bilaterally.  Moderate longitudinal arch.  Bilateral dorsiflexion to 85 only.  Bilateral hallux limitus  DATA OBTAINED: No notes on  file  ASSESSMENT & PLAN: See problem based charting & AVS for additional documentation Problem List Items Addressed This Visit    Tendonitis, Achilles, left - Primary    Alfredson exercises discussed today She has a hard he tried NSAIDs will defer at this time Large scaphoid pads added to her shoes Body Helix left ankle compression sleeve provided today. Discussed using during activity as well as for the first 2 hours following activity and PRN. If she feels this is helpful she will obtain one for her right leg as well.         FOLLOW UP:  Return in about 6 weeks (around 12/02/2014) for consider [facility] [place].

## 2014-11-11 ENCOUNTER — Other Ambulatory Visit: Payer: Self-pay | Admitting: Emergency Medicine

## 2014-12-02 ENCOUNTER — Ambulatory Visit (INDEPENDENT_AMBULATORY_CARE_PROVIDER_SITE_OTHER): Payer: BC Managed Care – PPO | Admitting: Sports Medicine

## 2014-12-02 ENCOUNTER — Encounter: Payer: Self-pay | Admitting: Sports Medicine

## 2014-12-02 VITALS — BP 108/67 | HR 71 | Ht 64.0 in | Wt 294.0 lb

## 2014-12-02 DIAGNOSIS — M7662 Achilles tendinitis, left leg: Secondary | ICD-10-CM | POA: Diagnosis not present

## 2014-12-02 MED ORDER — NITROGLYCERIN 0.2 MG/HR TD PT24: MEDICATED_PATCH | TRANSDERMAL | Status: DC

## 2014-12-02 NOTE — Patient Instructions (Signed)

## 2014-12-08 NOTE — Assessment & Plan Note (Signed)
Reviewed appropriate Alfredson exercises and emphasize the importance of these and promoting healing.  Focus on eccentric phase Body Helix left ankle compression sleeve provided today. Discussed using during activity as well as for the first 2 hours following activity and PRN. Begin nitroglycerin protocol

## 2014-12-08 NOTE — Progress Notes (Signed)
  April Duncan - 39 y.o. female MRN [id number]  Date of birth: [birth year]  SUBJECTIVE: Including CC, HPI, ROS HISTORY:   CHIEF COMPLAINT: left ankle pain, follow-up  HISTORY OF PRESENT ILLNESS:  left posterior heel pain is been present for greater than 7 to 8 months.  She has been performing some therapeutic exercises has done nitroglycerin protocol but is not been diligent with this.  It did show some early improvement, but has subsequently returned and is affecting her everyday life.  Described as a tightening that is worse after being on her feet for prolonged periods  REVIEW OF SYSTEMS:  denies any fevers, chills, recent weight gain or weight loss.  No significant nighttime awakenings due to this.  No specialty comments available. Social History   Occupational History  . 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 Smoking status: Never Smoker   . Smokeless tobacco: Never Used  . Alcohol Use: 0.5 oz/week    1 drink(s) per week  . Drug Use: No  . Sexual Activity: Yes    Birth Control/ Protection: None      Problem  Tendonitis, Achilles, Left    OBJECTIVE: HT:5' 4"  (162.6 cm) WT:294 lb (133.358 kg) BMI:50.6 BP:108/67 mmHg HR:71bpm TEMP: ( ) RESP:  PHYSICAL EXAM: GENERAL: adult obese Caucasian female. No acute distress PSYCH: Alert and appropriately interactive. SKIN: No open skin lesions or abnormal skin markings on areas inspected as below VASCULAR: DP and PT pulses 2+4.  No significant tibial edema. NEURO: Lower extremity strength is 5+/5 in all myotomes; sensation is intact to light touch in all dermatomes. LEFT ANKLE: does have prominence over the left Achilles.  No significant pre-or rupture calcaneal bursa.  Ankle dorsiflexion to 95.  Ankle is stable to drawer testing and strength is 5 out of 5 in inversion, E version, plantar flexion dorsiflexion AM}    DATA REVIEWED & OBTAINED:    Limited  [facility] Ultrasound of LEFT ANKLE: Findings: Significantly thickened Achilles tendon mid substance measuring 0.8 cm on the left and 0.6 cm on the right.  Increased neovascularity on the left, and normal on the right.  Impression: The above findings are consistent with achilles tendinopathy   ASSESSMENT & PLAN:Diagnoses and all orders for this visit:  Tendonitis, Achilles, left Orders: -     nitroGLYCERIN (MINITRAN) 0.2 mg/hr patch; Apply 1/4 patch to affected area once daily. Slightly alter placement site daily.   FOLLOW UP:  Return in about 6 weeks (around 01/13/2015) for repeat ultrasound.

## 2014-12-25 ENCOUNTER — Other Ambulatory Visit: Payer: Self-pay

## 2014-12-25 DIAGNOSIS — L309 Dermatitis, unspecified: Secondary | ICD-10-CM

## 2014-12-25 MED ORDER — TRIAMCINOLONE ACETONIDE 0.1 % EX CREA: TOPICAL_CREAM | Freq: Two times a day (BID) | CUTANEOUS | Status: AC

## 2014-12-25 NOTE — Telephone Encounter (Signed)
Dr L, you just saw pt in April, but not for eczema since 07/2013. Is it OK to RF her triamcinolone cream?

## 2015-01-13 ENCOUNTER — Ambulatory Visit (INDEPENDENT_AMBULATORY_CARE_PROVIDER_SITE_OTHER): Payer: BC Managed Care – PPO | Admitting: Sports Medicine

## 2015-01-13 ENCOUNTER — Encounter: Payer: Self-pay | Admitting: Sports Medicine

## 2015-01-13 VITALS — BP 132/83 | HR 78 | Ht 64.0 in | Wt 294.0 lb

## 2015-01-13 DIAGNOSIS — M7662 Achilles tendinitis, left leg: Secondary | ICD-10-CM

## 2015-01-14 NOTE — Progress Notes (Signed)
   Subjective:    Patient ID: April Duncan, female    DOB: [birth year], 39 y.o.   MRN: [id number]  HPI   Patient comes in today for follow-up on bilateral Achilles tendinopathy. Patient last saw Dr. Paulla Fore back on May 25. They had discussed nitroglycerin but she has a history of migraines. She has been compliant with her home exercises. Right heel pain has resolved but left heel pain persists. She is also complaining of pain along the plantar aspect of the left fifth metatarsal head. She states that she feels like there is "something in there". She has seen Dr. Rolley Sims in the past but has not seen her recently. No trauma.    Review of Systems     Objective:   Physical Exam Obese. No acute distress.  Left Achilles: Slight tenderness to palpation at the insertion onto the posterior aspect of the calcaneus. Mild thickening. Mild pain with Achilles stretch. Negative calcaneal squeeze.  Right Achilles: No tenderness to palpation. No pain with Achilles stretch.  Left foot: There is callus buildup along the plantar aspect of the fifth metatarsal head. No erythema. No skin breakdown. No pain with metatarsal squeeze. Collapse of both the transverse arch and transverse arch with standing.  [facility] ultrasound of the left Achilles was performed. Limited images were obtained. Previous ultrasound showed a thickened Achilles tendon at 0.8 cm. This has improved dramatically. She does have some calcification at the insertion onto the calcaneus. A quick scan of the plantar aspect of the fifth metatarsal demonstrates no foreign body. No underlying cortical irregularity or joint effusion.       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 I would like to try some 5/16 inch lifts in both shoes but this may aggravate her callus along the fifth metatarsal on her left foot. I recommended  that she return to [place] to have the callus pared down. She would benefit from custom orthotics and she may do that either through the podiatry office or through our office. We also talked about weight loss and how her obesity is contributing to her current pain. Follow-up with me again in 6 weeks.

## 2015-02-17 ENCOUNTER — Ambulatory Visit (INDEPENDENT_AMBULATORY_CARE_PROVIDER_SITE_OTHER): Payer: BC Managed Care – PPO | Admitting: Emergency Medicine

## 2015-02-17 VITALS — BP 102/78 | HR 81 | Temp 98.4°F | Resp 16 | Ht 64.0 in | Wt 292.0 lb

## 2015-02-17 DIAGNOSIS — M25519 Pain in unspecified shoulder: Secondary | ICD-10-CM | POA: Diagnosis not present

## 2015-02-17 DIAGNOSIS — E119 Type 2 diabetes mellitus without complications: Secondary | ICD-10-CM

## 2015-02-17 DIAGNOSIS — Z6841 Body Mass Index (BMI) 40.0 and over, adult: Secondary | ICD-10-CM

## 2015-02-17 LAB — LIPID PANEL
CHOLESTEROL: 98 mg/dL — AB (ref 125–200)
HDL: 55 mg/dL (ref >=46)
LDL Cholesterol: 32 mg/dL (ref <130)
Total CHOL/HDL Ratio: 1.8 Ratio (ref <=5.0)
Triglycerides: 55 mg/dL (ref <150)
VLDL: 11 mg/dL (ref <30)

## 2015-02-17 LAB — COMPREHENSIVE METABOLIC PANEL
ALT: 68 U/L — AB (ref 6–29)
AST: 76 U/L — ABNORMAL HIGH (ref 10–30)
Albumin: 3.8 g/dL (ref 3.6–5.1)
Alkaline Phosphatase: 76 U/L (ref 33–115)
BUN: 10 mg/dL (ref 7–25)
CO2: 26 mmol/L (ref 20–31)
CREATININE: 0.59 mg/dL (ref 0.50–1.10)
Calcium: 9.6 mg/dL (ref 8.6–10.2)
Chloride: 102 mmol/L (ref 98–110)
Glucose, Bld: 78 mg/dL (ref 65–99)
Potassium: 3.5 mmol/L (ref 3.5–5.3)
Sodium: 138 mmol/L (ref 135–146)
Total Bilirubin: 0.3 mg/dL (ref 0.2–1.2)
Total Protein: 7.2 g/dL (ref 6.1–8.1)

## 2015-02-17 LAB — CBC WITH DIFFERENTIAL/PLATELET
Basophils Absolute: 0 10*3/uL (ref 0.0–0.1)
Basophils Relative: 0 % (ref 0–1)
Eosinophils Absolute: 0.3 10*3/uL (ref 0.0–0.7)
Eosinophils Relative: 3 % (ref 0–5)
HCT: 34.9 % — ABNORMAL LOW (ref 36.0–46.0)
HEMOGLOBIN: 12.3 g/dL (ref 12.0–15.0)
LYMPHS ABS: 2.1 10*3/uL (ref 0.7–4.0)
LYMPHS PCT: 25 % (ref 12–46)
MCH: 28 pg (ref 26.0–34.0)
MCHC: 35.2 g/dL (ref 30.0–36.0)
MCV: 79.5 fL (ref 78.0–100.0)
MONOS PCT: 5 % (ref 3–12)
MPV: 9.3 fL (ref 8.6–12.4)
Monocytes Absolute: 0.4 10*3/uL (ref 0.1–1.0)
NEUTROS ABS: 5.6 10*3/uL (ref 1.7–7.7)
NEUTROS PCT: 67 % (ref 43–77)
Platelets: 319 10*3/uL (ref 150–400)
RBC: 4.39 MIL/uL (ref 3.87–5.11)
RDW: 15.2 % (ref 11.5–15.5)
WBC: 8.4 10*3/uL (ref 4.0–10.5)

## 2015-02-17 LAB — GLUCOSE, POCT (MANUAL RESULT ENTRY): POC GLUCOSE: 76 mg/dL (ref 70–99)

## 2015-02-17 LAB — POCT GLYCOSYLATED HEMOGLOBIN (HGB A1C): HEMOGLOBIN A1C: 6.5

## 2015-02-17 MED ORDER — NAPROXEN SODIUM 550 MG PO TABS: 550.0000 mg | ORAL_TABLET | Freq: Two times a day (BID) | ORAL | Status: DC

## 2015-02-17 MED ORDER — METFORMIN HCL ER 500 MG PO TB24: 2000.0000 mg | ORAL_TABLET | Freq: Every day | ORAL | Status: DC

## 2015-02-17 NOTE — Patient Instructions (Signed)

## 2015-02-17 NOTE — Progress Notes (Signed)
Subjective:  Patient ID: April Duncan, female    DOB: [birth year]  Age: 39 y.o. MRN: [id number]  CC: Medication Refill and pain in arm   HPI Mannie Wineland presents  with left arm over week ago. She said she works out frequently and doesn't recall any injury to her arm. She has no overuse or injury. She after the pain began above her shoulder she had her arm caught by elevator door. She has neurologic symptoms no numbness tingling or weakness. She has no ecchymosis or swelling. She denies any improvement with over-the-counter medication  She has type 2 diabetes and is noncompliant with her medication only taking a  thousand milligrams of metformin daily rather than prescribed 2 g. She does not check her sugar.  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   She has past surgical history that includes Gallbladder surgery; Hernia repair; Tubal ligation; and Cholecystectomy.   Her  family history includes Depression in her brother and father; Hypertension in her maternal grandmother and mother. There is no history of Autoimmune disease.  She   reports that she has never smoked. She has never used smokeless tobacco. She reports that she drinks about 0.6 oz of alcohol per week. She reports that she does not use illicit drugs.  Outpatient Prescriptions Prior to Visit  Medication Sig Dispense Refill  . amLODipine (NORVASC) 5 MG tablet     . fexofenadine (ALLEGRA) 180 MG tablet Take 1 tablet (180 mg total) by mouth daily. 30 tablet 11  . fluticasone (FLONASE) 50 MCG/ACT nasal spray Place 2 sprays into both nostrils daily. 16 g 12  . glucose blood test strip Use as instructed 100 each 12  . lisinopril (PRINIVIL,ZESTRIL) 40 MG tablet Take 1 tablet (40 mg total) by mouth daily. 90 tablet 3  . lisinopril-hydrochlorothiazide (PRINZIDE,ZESTORETIC)  20-12.5 MG per tablet     . metFORMIN (GLUCOPHAGE) 1000 MG tablet Take 1 tablet (1,000 mg total) by mouth 2 (two) times daily with a meal. 180 tablet 3  . metoprolol succinate (TOPROL-XL) 100 MG 24 hr tablet Take 1 tablet (100 mg total) by mouth daily. Take with or immediately following a meal. 90 tablet 3  . silver sulfADIAZINE (SILVADENE) 1 % cream Apply 1 application topically daily. 400 g 1  . triamcinolone cream (KENALOG) 0.1 % Apply topically 2 (two) times daily. Apply to dry skin on face 80 g 9  . albuterol (PROVENTIL HFA;VENTOLIN HFA) 108 (90 BASE) MCG/ACT inhaler Inhale 2 puffs into the lungs every 6 (six) hours as needed for wheezing. 1 Inhaler 4  . beclomethasone (QVAR) 80 MCG/ACT inhaler Inhale 2 puffs into the lungs 2 (two) times daily. 1 Inhaler 12  . chlorthalidone (HYGROTON) 25 MG tablet Take 1 tablet (25 mg total) by mouth daily. (Patient not taking: Reported on 02/17/2015) 90 tablet 3  . meloxicam (MOBIC) 15 MG tablet Take 1 tablet (15 mg total) by mouth daily. (Patient not taking: Reported on 10/09/2014) 30 tablet 0  . methocarbamol (ROBAXIN) 500 MG tablet Take 1 tablet (500 mg total) by mouth every 8 (eight) hours as needed for muscle spasms. (Patient not taking: Reported on 10/09/2014) 40 tablet 0  . nitroGLYCERIN (MINITRAN) 0.2 mg/hr patch Apply 1/4 patch to affected area once daily. Slightly alter placement site daily. (Patient not taking: Reported on 02/17/2015) 30 patch 1   No facility-administered medications prior to visit.  Social History   Social History  . Marital Status: Married    Spouse Name: N/A  . Number of Children: N/A  . Years of Education: N/A   Occupational History  . Teacher [employer]   Social History Main Topics  . Smoking status: Never Smoker   . Smokeless tobacco: Never Used  . Alcohol Use: 0.6 oz/week    1 Standard drinks or equivalent per week  . Drug Use: No  . Sexual Activity:  Yes    Birth Control/ Protection: None   Other Topics Concern  . None   Social History Narrative     Review of Systems  Constitutional: Negative for fever, chills and appetite change.  HENT: Negative for congestion, ear pain, postnasal drip, sinus pressure and sore throat.   Eyes: Negative for pain and redness.  Respiratory: Negative for cough, shortness of breath and wheezing.   Cardiovascular: Negative for leg swelling.  Gastrointestinal: Negative for nausea, vomiting, abdominal pain, diarrhea, constipation and blood in stool.  Endocrine: Negative for polyuria.  Genitourinary: Negative for dysuria, urgency, frequency and flank pain.  Musculoskeletal: Negative for gait problem.  Skin: Negative for rash.  Neurological: Negative for weakness and headaches.  Psychiatric/Behavioral: Negative for confusion and decreased concentration. The patient is not nervous/anxious.     Objective:  BP 102/78 mmHg  Pulse 81  Temp(Src) 98.4 F (36.9 C) (Oral)  Resp 16  Ht 5' 4"  (1.626 m)  Wt 292 lb (132.45 kg)  BMI 50.10 kg/m2  SpO2 98%  LMP 01/15/2015  Physical Exam  Constitutional: She is oriented to person, place, and time. She appears well-developed and well-nourished.  HENT:  Head: Normocephalic and atraumatic.  Eyes: Conjunctivae are normal. Pupils are equal, round, and reactive to light.  Pulmonary/Chest: Effort normal.  Musculoskeletal: She exhibits no edema.  Neurological: She is alert and oriented to person, place, and time.  Skin: Skin is dry.  Psychiatric: She has a normal mood and affect. Her behavior is normal. Thought content normal.      Assessment & Plan:   Contina was seen today for medication refill and pain in arm.  Diagnoses and all orders for this visit:  Type 2 diabetes mellitus without complication -     POCT glycosylated hemoglobin (Hb A1C) -     POCT glucose (manual entry) -     CBC with Differential/Platelet -     Comprehensive metabolic panel -      Lipid panel  Pain in joint, shoulder region, unspecified laterality -     POCT glycosylated hemoglobin (Hb A1C) -     POCT glucose (manual entry) -     CBC with Differential/Platelet -     Comprehensive metabolic panel -     Lipid panel  Morbid obesity with BMI of 50.0-59.9, adult  Other orders -     naproxen sodium (ANAPROX DS) 550 MG tablet; Take 1 tablet (550 mg total) by mouth 2 (two) times daily with a meal. -     metFORMIN (GLUCOPHAGE XR) 500 MG 24 hr tablet; Take 4 tablets (2,000 mg total) by mouth at bedtime.   I am having Ms. Lineback start on naproxen sodium and metFORMIN. I am also having her maintain her fexofenadine, glucose blood, fluticasone, albuterol, beclomethasone, silver sulfADIAZINE, meloxicam, metFORMIN, methocarbamol, lisinopril, chlorthalidone, metoprolol succinate, amLODipine, lisinopril-hydrochlorothiazide, nitroGLYCERIN, and triamcinolone cream.  Meds ordered this encounter  Medications  . naproxen sodium (ANAPROX DS) 550  MG tablet    Sig: Take 1 tablet (550 mg total) by mouth 2 (two) times daily with a meal.    Dispense:  40 tablet    Refill:  0  . metFORMIN (GLUCOPHAGE XR) 500 MG 24 hr tablet    Sig: Take 4 tablets (2,000 mg total) by mouth at bedtime.    Dispense:  120 tablet    Refill:  5    Appropriate red flag conditions were discussed with the patient as well as actions that should be taken.  Patient expressed his understanding.  Follow-up: Return in about 6 months (around 08/20/2015).  Roselee Culver, MD  Results for orders placed or performed in visit on 02/17/15  POCT glycosylated hemoglobin (Hb A1C)  Result Value Ref Range   Hemoglobin A1C 6.5   POCT glucose (manual entry)  Result Value Ref Range   POC Glucose 76 70 - 99 mg/dl

## 2015-02-24 ENCOUNTER — Ambulatory Visit: Payer: BC Managed Care – PPO | Admitting: Sports Medicine

## 2015-04-13 ENCOUNTER — Encounter: Payer: Self-pay | Admitting: Emergency Medicine

## 2015-07-07 ENCOUNTER — Ambulatory Visit (INDEPENDENT_AMBULATORY_CARE_PROVIDER_SITE_OTHER): Payer: BC Managed Care – PPO

## 2015-07-07 ENCOUNTER — Ambulatory Visit (INDEPENDENT_AMBULATORY_CARE_PROVIDER_SITE_OTHER): Payer: BC Managed Care – PPO | Admitting: Physician Assistant

## 2015-07-07 VITALS — BP 130/80 | HR 96 | Temp 98.1°F | Resp 18 | Ht 64.0 in | Wt 298.2 lb

## 2015-07-07 DIAGNOSIS — B9789 Other viral agents as the cause of diseases classified elsewhere: Secondary | ICD-10-CM

## 2015-07-07 DIAGNOSIS — Z23 Encounter for immunization: Secondary | ICD-10-CM

## 2015-07-07 DIAGNOSIS — M25561 Pain in right knee: Secondary | ICD-10-CM

## 2015-07-07 DIAGNOSIS — J069 Acute upper respiratory infection, unspecified: Secondary | ICD-10-CM | POA: Diagnosis not present

## 2015-07-07 DIAGNOSIS — M542 Cervicalgia: Secondary | ICD-10-CM | POA: Diagnosis not present

## 2015-07-07 DIAGNOSIS — E119 Type 2 diabetes mellitus without complications: Secondary | ICD-10-CM | POA: Diagnosis not present

## 2015-07-07 LAB — POCT GLYCOSYLATED HEMOGLOBIN (HGB A1C): Hemoglobin A1C: 6.4

## 2015-07-07 MED ORDER — HYDROCODONE-HOMATROPINE 5-1.5 MG/5ML PO SYRP: 2.5000 mL | ORAL_SOLUTION | Freq: Every evening | ORAL | Status: DC | PRN

## 2015-07-07 MED ORDER — IBUPROFEN 600 MG PO TABS: 600.0000 mg | ORAL_TABLET | Freq: Three times a day (TID) | ORAL | Status: DC | PRN

## 2015-07-07 MED ORDER — METHOCARBAMOL 500 MG PO TABS: 500.0000 mg | ORAL_TABLET | Freq: Three times a day (TID) | ORAL | Status: DC | PRN

## 2015-07-07 MED ORDER — METFORMIN HCL ER 500 MG PO TB24: 2000.0000 mg | ORAL_TABLET | Freq: Every day | ORAL | Status: AC

## 2015-07-07 NOTE — Progress Notes (Signed)
07/07/2015 7:47 PM   DOB: 1976-04-07 / MRN: [id number]  SUBJECTIVE:  HPI  April Duncan is a 39 y.o. female here today for knee pain after a fall that occurred yesterday. Reports that she feel twice yesterday while snow tubing and on the second fall felt her right knee twist and she heard a pop. He pain is generalized pain and swelling of the knee, and she denies bruising.  She denies a history of knee problems.  She has been having sore throat for 1 month now. Reports that it did not hurt for roughly two weeks after the first week and now has started to bother her again.  She denies fever. No change in PO intake.  She also complains of coughing, stuffy nose.  She has a history of asthma and has tried using her inhaler with good relief of cough.  She has tried Mucinex without relief.    She also complains of sore throat and would like a refill of her methocarbamol.    She reports a history of well controlled diabetes and would like a refill of her metformin. She denies sock and glove paresthesia.  She 3-4 episodes of nocturia nightly and reports excessive thirst. She is a never smoker. She denies alcohol use. She is trying to diet and lose weight and has had some success.     She has No Known Allergies.   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    She  reports that she has never smoked. She has never used smokeless tobacco. She reports that she drinks about 0.6 oz of alcohol per week. She reports that she does not use illicit drugs. She  reports that she currently engages in sexual activity. She reports using the following method of birth control/protection: None. The patient  has past surgical history that includes Gallbladder surgery; Hernia repair; Tubal ligation; and Cholecystectomy.  Her family history includes Depression in her brother and father;  Hypertension in her maternal grandmother and mother. There is no history of Autoimmune disease.  Review of Systems  Constitutional: Negative for fever.  Gastrointestinal: Negative for nausea.  Genitourinary: Negative for dysuria, urgency and frequency.  Musculoskeletal: Positive for joint pain.  Skin: Negative for rash.  Neurological: Negative for headaches.  Psychiatric/Behavioral: Negative for depression.    Problem list and medications reviewed and updated by myself where necessary, and exist elsewhere in the encounter.   OBJECTIVE:  BP 130/80 mmHg  Pulse 96  Temp(Src) 98.1 F (36.7 C) (Oral)  Resp 18  Ht 5' 4"  (1.626 m)  Wt 298 lb 3.2 oz (135.263 kg)  BMI 51.16 kg/m2  SpO2 96%  LMP 06/26/2015  Physical Exam  Constitutional: She is oriented to person, place, and time. She appears well-developed.  Eyes: EOM are normal. Pupils are equal, round, and reactive to light.  Cardiovascular: Normal rate.   Pulmonary/Chest: Effort normal.  Abdominal: She exhibits no distension.  Musculoskeletal:       Right knee: She exhibits decreased range of motion and MCL laxity. She exhibits no effusion, no ecchymosis, normal alignment, no LCL laxity, no bony tenderness and normal meniscus. Tenderness found. Medial joint line and MCL tenderness noted. No LCL and no patellar tendon tenderness noted.  Neurological: She is alert and oriented to person, place, and time. No cranial nerve deficit.  Skin: Skin is warm and dry. She is not diaphoretic.  Psychiatric:  She has a normal mood and affect.  Vitals reviewed.   Results for orders placed or performed in visit on 07/07/15 (from the past 48 hour(s))  POCT glycosylated hemoglobin (Hb A1C)     Status: None   Collection Time: 07/07/15  7:19 PM  Result Value Ref Range   Hemoglobin A1C 6.4     ASSESSMENT AND PLAN  Avagrace was seen today for knee injury, sore throat, medication refill and flu vaccine.  Diagnoses and all orders for this  visit:  Need for influenza vaccination -     Flu Vaccine QUAD 36+ mos IM  Controlled type 2 diabetes mellitus without complication, without long-term current use of insulin (HCC) -     POCT glycosylated hemoglobin (Hb A1C) -     COMPLETE METABOLIC PANEL WITH GFR -     Lipid panel -     Microalbumin, urine -     metFORMIN (GLUCOPHAGE XR) 500 MG 24 hr tablet; Take 4 tablets (2,000 mg total) by mouth at bedtime.  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  Will see her back in one week.  Advised RICE and Ibuprofen per below.  She is to return in one week.   -     DG Knee Complete 4 Views Right; Future -     ibuprofen (ADVIL,MOTRIN) 600 MG tablet; Take 1 tablet (600 mg total) by mouth every 8 (eight) hours as needed. -     Apply knee sleeve  Neck pain: Historical diagnosis attributed to neck spasm. She is using sparingly.  Will refill.   -     methocarbamol (ROBAXIN) 500 MG tablet; Take 1 tablet (500 mg total) by mouth every 8 (eight) hours as needed for muscle spasms.  Viral URI with cough: No alarm symptoms.   -     HYDROcodone-homatropine (HYCODAN) 5-1.5 MG/5ML syrup; Take 2.5-5 mLs by mouth at bedtime as needed.  Other orders -     Cancel: Flu vaccine > 3yo with preservative IM (Fluvirin Influenza Split)    The patient was advised to call or return to clinic if she does not see an improvement in symptoms or to seek the care of the closest emergency department if she worsens with the above plan.   Philis Fendt, MHS, PA-C [facility] 07/07/2015 7:47 PM

## 2015-07-08 LAB — COMPLETE METABOLIC PANEL WITH GFR
ALT: 45 U/L — ABNORMAL HIGH (ref 6–29)
AST: 39 U/L — AB (ref 10–30)
Albumin: 3.8 g/dL (ref 3.6–5.1)
Alkaline Phosphatase: 77 U/L (ref 33–115)
BUN: 16 mg/dL (ref 7–25)
CO2: 25 mmol/L (ref 20–31)
Calcium: 9.2 mg/dL (ref 8.6–10.2)
Chloride: 102 mmol/L (ref 98–110)
Creat: 0.84 mg/dL (ref 0.50–1.10)
GFR, EST NON AFRICAN AMERICAN: 87 mL/min (ref >=60)
GFR, Est African American: >89 mL/min (ref >=60)
Glucose, Bld: 88 mg/dL (ref 65–99)
POTASSIUM: 3.5 mmol/L (ref 3.5–5.3)
Sodium: 138 mmol/L (ref 135–146)
Total Bilirubin: 0.3 mg/dL (ref 0.2–1.2)
Total Protein: 7.2 g/dL (ref 6.1–8.1)

## 2015-07-08 LAB — MICROALBUMIN, URINE: Microalb, Ur: 0.8 mg/dL

## 2015-07-08 LAB — LIPID PANEL
CHOL/HDL RATIO: 1.7 ratio (ref <=5.0)
CHOLESTEROL: 100 mg/dL — AB (ref 125–200)
HDL: 60 mg/dL (ref >=46)
LDL Cholesterol: 26 mg/dL (ref <130)
Triglycerides: 68 mg/dL (ref <150)
VLDL: 14 mg/dL (ref <30)

## 2015-09-05 ENCOUNTER — Other Ambulatory Visit: Payer: Self-pay | Admitting: Family Medicine

## 2015-09-06 ENCOUNTER — Other Ambulatory Visit: Payer: Self-pay

## 2015-09-06 MED ORDER — GLUCOSE BLOOD VI STRP: ORAL_STRIP | Status: DC

## 2015-10-11 ENCOUNTER — Other Ambulatory Visit: Payer: Self-pay | Admitting: Family Medicine

## 2015-10-29 ENCOUNTER — Other Ambulatory Visit: Payer: Self-pay | Admitting: Family Medicine

## 2015-12-09 ENCOUNTER — Other Ambulatory Visit: Payer: Self-pay | Admitting: Surgery

## 2015-12-09 DIAGNOSIS — R109 Unspecified abdominal pain: Secondary | ICD-10-CM

## 2015-12-14 ENCOUNTER — Other Ambulatory Visit: Payer: Self-pay | Admitting: Physician Assistant

## 2015-12-15 ENCOUNTER — Telehealth: Payer: Self-pay | Admitting: *Deleted

## 2015-12-15 NOTE — Telephone Encounter (Signed)
Informed patient she will need appointment for further refills.  Patient understood.

## 2015-12-27 ENCOUNTER — Inpatient Hospital Stay: Admission: RE | Admit: 2015-12-27 | Payer: BC Managed Care – PPO | Source: Ambulatory Visit

## 2016-01-10 ENCOUNTER — Other Ambulatory Visit: Payer: Self-pay | Admitting: Emergency Medicine

## 2016-01-13 ENCOUNTER — Other Ambulatory Visit: Payer: Self-pay | Admitting: Emergency Medicine

## 2016-01-14 ENCOUNTER — Other Ambulatory Visit: Payer: Self-pay | Admitting: Physician Assistant

## 2016-01-17 NOTE — Telephone Encounter (Signed)
Pt was called last month and advised that she needed an OV for more RFs and agreed. She has not been in and don't see appt sch. Called and [facility] for pt to ask when she can come in. Pended just 1 week of each med.

## 2016-01-24 NOTE — Telephone Encounter (Signed)
Pt now has a new provider and she has her medication

## 2016-01-30 ENCOUNTER — Other Ambulatory Visit: Payer: Self-pay | Admitting: Physician Assistant

## 2016-01-31 ENCOUNTER — Encounter: Payer: Self-pay | Admitting: Physician Assistant

## 2016-01-31 ENCOUNTER — Ambulatory Visit (INDEPENDENT_AMBULATORY_CARE_PROVIDER_SITE_OTHER): Payer: BC Managed Care – PPO | Admitting: Physician Assistant

## 2016-01-31 VITALS — BP 112/80 | HR 95 | Temp 98.4°F | Resp 16 | Ht 64.0 in | Wt 287.0 lb

## 2016-01-31 DIAGNOSIS — I10 Essential (primary) hypertension: Secondary | ICD-10-CM | POA: Diagnosis not present

## 2016-01-31 DIAGNOSIS — A09 Infectious gastroenteritis and colitis, unspecified: Secondary | ICD-10-CM | POA: Diagnosis not present

## 2016-01-31 DIAGNOSIS — R197 Diarrhea, unspecified: Secondary | ICD-10-CM

## 2016-01-31 MED ORDER — LISINOPRIL 40 MG PO TABS: 40.0000 mg | ORAL_TABLET | Freq: Every day | ORAL | 0 refills | Status: AC

## 2016-01-31 NOTE — Progress Notes (Signed)
01/31/2016 7:03 PM   DOB: 08-16-1975 / MRN: [id number]  SUBJECTIVE:  April Duncan is a 40 y.o. female presenting for non bloody diarrhea that started 10 days ago while traveling oversees.  Reports that since that time she has had 3-4 watery stools daily with no sign of improvement. States that all of the people she was traveling with also have similar symptoms.  She denies fever, chills. She associates occasional abdominal cramping that is relieved with moving her bowels.   She has No Known Allergies.   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    She  reports that she has never smoked. She has never used smokeless tobacco. She reports that she drinks about 0.6 oz of alcohol per week . She reports that she does not use drugs. She  reports that she currently engages in sexual activity. She reports using the following method of birth control/protection: None. The patient  has a past surgical history that includes Gallbladder surgery; Hernia repair; Tubal ligation; and Cholecystectomy.  Her family history includes Depression in her brother and father; Hypertension in her maternal grandmother and mother.  Review of Systems  Constitutional: Negative for chills and fever.  Cardiovascular: Negative for chest pain.  Gastrointestinal: Positive for abdominal pain and diarrhea. Negative for nausea and vomiting.  Genitourinary: Negative for dysuria, flank pain, frequency and urgency.  Musculoskeletal: Negative for myalgias.  Neurological: Negative for dizziness and headaches.    Problem list and medications reviewed and updated by myself where necessary, and exist elsewhere in the encounter.   OBJECTIVE:  BP 112/80 (BP Location: Right Arm)   Pulse 95   Temp 98.4 F (36.9 C) (Oral)   Resp 16   Ht 5' 4"  (1.626 m)   Wt 287 lb (130.2 kg)   SpO2 97%   BMI 49.26  kg/m   Physical Exam  Constitutional: She is oriented to person, place, and time. She appears well-nourished. No distress.  Eyes: EOM are normal. Pupils are equal, round, and reactive to light.  Cardiovascular: Normal rate, regular rhythm and normal heart sounds.   Pulmonary/Chest: Effort normal. No respiratory distress.  Abdominal: She exhibits no distension and no mass. There is no tenderness. There is no rebound and no guarding.  Musculoskeletal: Normal range of motion.  Neurological: She is alert and oriented to person, place, and time. No cranial nerve deficit. Gait normal.  Skin: Skin is dry. She is not diaphoretic.  Psychiatric: She has a normal mood and affect.  Vitals reviewed.   No results found for this or any previous visit (from the past 72 hour(s)).  No results found.  Lab Results  Component Value Date   HGBA1C 6.4 07/07/2015    ASSESSMENT AND PLAN  April Duncan was seen today for medication refill.  Diagnoses and all orders for this visit:  Essential hypertension: Her family care practitioner works outside of [facility].  I will get her enough medication to last 60 days which should be ample time to return to her PCP.  -     lisinopril (PRINIVIL,ZESTRIL) 40 MG tablet; Take 1 tablet (40 mg total) by mouth daily. -     Creatinine with Est GFR -     Potassium  Diarrhea of presumed infectious origin: Will attempt to find a treatable etiology.  I have advised imodium in the meantime.  -     Gastrointestinal Pathogen Panel PCR  The patient was advised to call or return to clinic if she does not see an improvement in symptoms, or to seek the care of the closest emergency department if she worsens with the above plan.   Philis Fendt, MHS, PA-C [facility] 01/31/2016 7:03 PM

## 2016-01-31 NOTE — Telephone Encounter (Signed)
April Duncan had spoken to this pt last month and advised that she needed OV for more RFs. Called pt to see when she could come in and transferred her to operator to sch appt. She is sch for today so will let provider do full RFs then.

## 2016-01-31 NOTE — Patient Instructions (Signed)
     IF you received an x-ray today, you will receive an invoice from [facility]. Please contact [facility] at [phone] with questions or concerns regarding your invoice.   IF you received labwork today, you will receive an invoice from [facility]. Please contact [facility] at [phone] with questions or concerns regarding your invoice.   Our billing staff will not be able to assist you with questions regarding bills from these companies.  You will be contacted with the lab results as soon as they are available. The fastest way to get your results is to activate your My Chart account. Instructions are located on the last page of this paperwork. If you have not heard from us regarding the results in 2 weeks, please contact this office.      

## 2016-02-01 LAB — CREATININE WITH EST GFR
Creat: 0.72 mg/dL (ref 0.50–1.10)
GFR, Est African American: >89 mL/min (ref >=60)
GFR, Est Non African American: >89 mL/min (ref >=60)

## 2016-02-01 LAB — POTASSIUM: POTASSIUM: 3.2 mmol/L — AB (ref 3.5–5.3)

## 2016-02-03 LAB — GASTROINTESTINAL PATHOGEN PANEL PCR
C. DIFFICILE TOX A/B, PCR: NOT DETECTED
CRYPTOSPORIDIUM, PCR: NOT DETECTED
Campylobacter, PCR: NOT DETECTED
E COLI (ETEC) LT/ST, PCR: NOT DETECTED
E COLI (STEC) STX1/STX2, PCR: NOT DETECTED
E COLI 0157, PCR: NOT DETECTED
Giardia lamblia, PCR: NOT DETECTED
Norovirus, PCR: NOT DETECTED
Rotavirus A, PCR: NOT DETECTED
SHIGELLA, PCR: NOT DETECTED
Salmonella, PCR: NOT DETECTED

## 2016-02-10 ENCOUNTER — Other Ambulatory Visit: Payer: Self-pay | Admitting: Emergency Medicine

## 2016-03-17 ENCOUNTER — Other Ambulatory Visit: Payer: Self-pay | Admitting: Physician Assistant

## 2016-03-20 ENCOUNTER — Other Ambulatory Visit: Payer: Self-pay | Admitting: Physician Assistant

## 2016-06-02 ENCOUNTER — Other Ambulatory Visit: Payer: Self-pay | Admitting: Family Medicine

## 2016-06-02 DIAGNOSIS — J45909 Unspecified asthma, uncomplicated: Secondary | ICD-10-CM

## 2016-08-28 ENCOUNTER — Encounter (HOSPITAL_COMMUNITY): Payer: Self-pay

## 2016-08-28 ENCOUNTER — Emergency Department (HOSPITAL_COMMUNITY)
Admission: EM | Admit: 2016-08-28 | Discharge: 2016-08-28 | Disposition: A | Discharge status: Home / Self Care | Payer: BLUE CROSS/BLUE SHIELD | Attending: Emergency Medicine | Admitting: Emergency Medicine

## 2016-08-28 ENCOUNTER — Emergency Department (HOSPITAL_COMMUNITY): Payer: BLUE CROSS/BLUE SHIELD

## 2016-08-28 DIAGNOSIS — Z7982 Long term (current) use of aspirin: Secondary | ICD-10-CM | POA: Diagnosis not present

## 2016-08-28 DIAGNOSIS — E119 Type 2 diabetes mellitus without complications: Secondary | ICD-10-CM | POA: Diagnosis not present

## 2016-08-28 DIAGNOSIS — J45909 Unspecified asthma, uncomplicated: Secondary | ICD-10-CM | POA: Insufficient documentation

## 2016-08-28 DIAGNOSIS — I10 Essential (primary) hypertension: Secondary | ICD-10-CM | POA: Insufficient documentation

## 2016-08-28 DIAGNOSIS — Z79899 Other long term (current) drug therapy: Secondary | ICD-10-CM | POA: Insufficient documentation

## 2016-08-28 DIAGNOSIS — R0789 Other chest pain: Secondary | ICD-10-CM | POA: Diagnosis not present

## 2016-08-28 DIAGNOSIS — R071 Chest pain on breathing: Secondary | ICD-10-CM | POA: Diagnosis present

## 2016-08-28 LAB — CBG MONITORING, ED: GLUCOSE-CAPILLARY: 113 mg/dL — AB (ref 65–99)

## 2016-08-28 MED ORDER — NAPROXEN 500 MG PO TABS: 500.0000 mg | ORAL_TABLET | Freq: Two times a day (BID) | ORAL | 0 refills | Status: AC

## 2016-08-28 NOTE — ED Notes (Signed)
Patient had labs drawn under name of Mechelle Adams. PA aware and has viewed labs under other name.

## 2016-08-28 NOTE — ED Notes (Signed)
PREVIOUS I-STAT TROP 0.00 AT 1808.

## 2016-08-28 NOTE — ED Triage Notes (Signed)
PT C/O LEFT-SIDED SHARP CHEST PAIN SINCE 3 PM. PT ALSO C/O LEFT-SIDED NECK PAIN RADIATING TO THE BACK OF HER HEAD. PT DENIES N/V.  PT PREVIOUSLY SIGNED IN UNDER THE WRONG NAME, SO TREATMENT WILL BE STARTED AGAIN. EKG AND BLOOD WERE VOIDED DUE TO PT'S MISTAKE.

## 2016-08-28 NOTE — ED Notes (Signed)
Patient had labs drawn under different name. Patient is a hard stick and will need ultrasound iv and blood draw.

## 2016-08-28 NOTE — ED Provider Notes (Signed)
[facility] DEPT Provider Note   CSN: [id number] Arrival date & time: 08/28/16  1813    History   Chief Complaint Chief Complaint  Patient presents with  . Chest Pain  . Neck Pain    HPI April Duncan is a 41 y.o. female.  41 year old female with a history of diabetes, hypertension, PCOS, and asthma presents to the emergency department for evaluation of chest pain. She states that she noticed an aching central chest pain earlier today. This travels slightly into her left chest. She states that pain is worse with deep breathing. No medications taken PTA. Patient reports some mild associated dizziness characterized as things "spinning". She has not had any falls, trauma, or injury. No associated shortness of breath, nausea, vomiting, fevers, new or worsening leg swelling, extremity numbness, weakness, diaphoresis, recent surgeries, or recent hospitalizations. Symptoms preceded by right sided neck pain x 1 week which initially felt like she had "slept wrong" as it was noticeable upon waking one morning. No personal or FHx of ACS. No personal or FHx of DVT/PE.   The history is provided by the patient. No language interpreter was used.  Chest Pain    Neck Pain   Associated symptoms include chest pain.    Past Medical History:  Diagnosis Date  . Allergy   . Anemia   . Asthma   . Diabetes mellitus   . Hypertension   . PCOS (polycystic ovarian syndrome)   . 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 Tendonitis, Achilles, left 10/21/2014  . Morbid obesity with BMI of 50.0-59.9, adult ([place]) 03/12/2013  . Diabetes mellitus 08/19/2011  . Hypertension 08/19/2011  . PCOS (polycystic ovarian syndrome) 08/19/2011  . 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 CHOLECYSTECTOMY    . GALLBLADDER SURGERY    . HERNIA REPAIR    . TUBAL LIGATION      OB History    No data available       Home Medications    Prior to Admission medications   Medication Sig Start Date End Date Taking? Authorizing Provider  aspirin EC 81 MG tablet Take 81 mg by mouth every evening.   Yes Historical Provider, MD  Biotin 1000 MCG tablet Take 1,000 mcg by mouth at bedtime.   Yes Historical Provider, MD  chlorthalidone (HYGROTON) 25 MG tablet TAKE ONE TABLET BY MOUTH ONCE DAILY Patient taking differently: TAKE 25 MG BY MOUTH EVERY EVENING 01/14/16  Yes Darlyne Russian, MD  Cranberry 1000 MG CAPS Take 1,000 mg by mouth at bedtime.   Yes Historical Provider, MD  fexofenadine (ALLEGRA) 180 MG tablet Take 1 tablet (180 mg total) by mouth daily. Patient taking differently: Take 180 mg by mouth daily as needed for allergies.  03/12/13  Yes Duane Boston, MD  Lactobacillus (PROBIOTIC ACIDOPHILUS PO) Take 3 capsules by mouth at bedtime.   Yes Historical Provider, MD  lisinopril (PRINIVIL,ZESTRIL) 40 MG tablet Take 1 tablet (40 mg total) by mouth daily. Patient taking differently: Take 40 mg by mouth every evening.  01/31/16  Yes Tereasa Coop, PA-C  metFORMIN (GLUCOPHAGE XR) 500 MG 24 hr tablet Take 4 tablets (2,000 mg total) by mouth at bedtime. Patient taking differently: Take 1,000 mg by mouth at bedtime.  07/07/15  Yes Tereasa Coop, PA-C  metoprolol succinate (TOPROL-XL) 100 MG 24 hr tablet TAKE ONE TABLET BY  MOUTH ONCE DAILY TAKE  WITH  OR  IMMEDIATELY  FOLLOWING  A  MEAL Patient taking differently: TAKE 100 MG BY MOUTH EVERY EVENING 12/15/15  Yes Tereasa Coop, PA-C  Potassium 99 MG TABS Take 99 mg by mouth at bedtime.   Yes Historical Provider, MD  Prenatal Vit-Fe Fumarate-FA (PRENATAL PO) Take 1 tablet by mouth at bedtime.   Yes Historical Provider, MD  silver sulfADIAZINE (SILVADENE) 1 % cream Apply 1 application topically daily. Patient taking differently: Apply 1 application topically daily as needed (dry skin).  07/31/13  Yes Robyn Haber, MD  triamcinolone cream  (KENALOG) 0.1 % Apply topically 2 (two) times daily. Apply to dry skin on face Patient taking differently: Apply 1 application topically 2 (two) times daily as needed (dry skin). Apply to dry skin on face 12/25/14  Yes Robyn Haber, MD  naproxen (NAPROSYN) 500 MG tablet Take 1 tablet (500 mg total) by mouth 2 (two) times daily. 08/28/16   Antonietta Breach, PA-C    Family History Family History  Problem Relation Age of Onset  . Hypertension Mother   . Depression Father   . Depression Brother   . Hypertension Maternal Grandmother   . Autoimmune disease Neg Hx     Social History Social History  Substance Use Topics  . Smoking status: Never Smoker  . Smokeless tobacco: Never Used  . Alcohol use 0.6 oz/week    1 Standard drinks or equivalent per week     Allergies   Patient has no known allergies.   Review of Systems Review of Systems  Cardiovascular: Positive for chest pain.  Musculoskeletal: Positive for neck pain.  Ten systems reviewed and are negative for acute change, except as noted in the HPI.    Physical Exam Updated Vital Signs BP 152/88   Pulse 79   Temp 97.8 F (36.6 C) (Oral)   Resp 16   Ht 5' 4"  (1.626 m)   Wt 132.7 kg   SpO2 97%   BMI 50.22 kg/m   Physical Exam  Constitutional: She is oriented to person, place, and time. She appears well-developed and well-nourished. No distress.  Nontoxic appearing and in NAD.  HENT:  Head: Normocephalic and atraumatic.  Eyes: Conjunctivae and EOM are normal. No scleral icterus.  Neck: Normal range of motion.  No JVD  Cardiovascular: Normal rate, regular rhythm and intact distal pulses.   Pulmonary/Chest: Effort normal. No respiratory distress. She has no wheezes. She has no rales. She exhibits tenderness.    Musculoskeletal: Normal range of motion. She exhibits no edema.  No pitting edema in BLE  Neurological: She is alert and oriented to person, place, and time. She exhibits normal muscle tone. Coordination  normal.  Skin: Skin is warm and dry. No rash noted. She is not diaphoretic. No erythema. No pallor.  Psychiatric: She has a normal mood and affect. Her behavior is normal.  Nursing note and vitals reviewed.    ED Treatments / Results  Labs (all labs ordered are listed, but only abnormal results are displayed) Labs Reviewed  CBG MONITORING, ED - Abnormal; Notable for the following:       Result Value   Glucose-Capillary 113 (*)    All other components within normal limits   Results for orders placed or performed during the hospital encounter of 08/28/16  CBC  Result Value Ref Range   WBC 10.3 4.0 - 10.5 K/uL   RBC 4.19 3.87 - 5.11 MIL/uL   Hemoglobin 12.3 12.0 -  15.0 g/dL   HCT 36.6 36.0 - 46.0 %   MCV 87.4 78.0 - 100.0 fL   MCH 29.4 26.0 - 34.0 pg   MCHC 33.6 30.0 - 36.0 g/dL   RDW 14.7 11.5 - 15.5 %   Platelets 296 150 - 400 K/uL  Comprehensive metabolic panel  Result Value Ref Range   Sodium 141 135 - 145 mmol/L   Potassium 3.2 (L) 3.5 - 5.1 mmol/L   Chloride 113 (H) 101 - 111 mmol/L   CO2 22 22 - 32 mmol/L   Glucose, Bld 128 (H) 65 - 99 mg/dL   BUN 17 6 - 20 mg/dL   Creatinine, Ser 0.79 0.44 - 1.00 mg/dL   Calcium 9.5 8.9 - 10.3 mg/dL   Total Protein 7.7 6.5 - 8.1 g/dL   Albumin 3.6 3.5 - 5.0 g/dL   AST 36 15 - 41 U/L   ALT 35 14 - 54 U/L   Alkaline Phosphatase 87 38 - 126 U/L   Total Bilirubin 0.8 0.3 - 1.2 mg/dL   GFR calc non Af Amer >60 >60 mL/min   GFR calc Af Amer >60 >60 mL/min   Anion gap 6 5 - 15  I-stat troponin, ED  Result Value Ref Range   Troponin i, poc 0.00 0.00 - 0.08 ng/mL   Comment 3          I-Stat beta hCG blood, ED  Result Value Ref Range   I-stat hCG, quantitative <5.0 <5 mIU/mL   Comment 3            EKG  EKG Interpretation  Date/Time:  Monday August 28 2016 18:40:17 EST Ventricular Rate:  76 PR Interval:    QRS Duration: 105 QT Interval:  395 QTC Calculation: 445 R Axis:   -41 Text Interpretation:  Sinus rhythm Left axis  deviation Low voltage, precordial leads Baseline wander in lead(s) V6 Partial missing lead(s): V2 V3 V4 V5 V6 Otherwise no significant change Confirmed by [facility] MD, PEDRO ([id number]) on 08/28/2016 10:20:35 PM       Radiology Dg Chest 2 View  Result Date: 08/28/2016 CLINICAL DATA:  Chest pain and shortness of breath today. EXAM: CHEST  2 VIEW COMPARISON:  CT chest 11/04/2011. FINDINGS: Lungs clear. Heart size normal. No pneumothorax or pleural fluid. No bony abnormality. IMPRESSION: Negative chest. Electronically Signed   By: Inge Rise M.D.   On: 08/28/2016 18:47    Procedures Procedures (including critical care time)  Medications Ordered in ED Medications - No data to display   Initial Impression / Assessment and Plan / ED Course  I have reviewed the triage vital signs and the nursing notes.  Pertinent labs & imaging results that were available during my care of the patient were reviewed by me and considered in my medical decision making (see chart for details).     41 year old female presents to the emergency department for evaluation of chest pain. Chest pain began today and was preceded by one week of right-sided neck pain. Chest pain today is reproducible on palpation. High suspicion for musculoskeletal etiology. Patient has a reassuring cardiac workup today. Her troponin is negative. EKG is unchanged from prior. She has heart score of 2 consistent with low risk of acute coronary event. Patient also low risk for pulmonary embolus. She has PERC negative. CXR without evidence of acute cardiopulmonary abnormalities.  It is also possible that patient's symptoms may be aggravated due to recent increased stress. She states that her brother was recently  diagnosed with a stroke. She has been very concerned about his prognosis and has been spending a lot of time in the hospital with him. I have attempted to comfort the patient, who is tearful upon speaking about this ordeal. I have  reassured her that her workup today does not suggest any emergent process. I having encouraged the patient to follow-up with her primary care doctor for further evaluation of her symptoms. Will manage supportively with NSAIDs. Return precautions discussed and provided. Patient discharged in stable condition with no unaddressed concerns.   Final Clinical Impressions(s) / ED Diagnoses   Final diagnoses:  Chest wall pain    New Prescriptions Discharge Medication List as of 08/28/2016 10:44 PM    START taking these medications   Details  naproxen (NAPROSYN) 500 MG tablet Take 1 tablet (500 mg total) by mouth 2 (two) times daily., Starting Mon 08/28/2016, Print         [place], PA-C 08/29/16 2947    Fatima Blank, MD 08/30/16 [phone]

## 2016-08-28 NOTE — Discharge Instructions (Signed)
Take Naproxen as prescribed for pain. You may apply ice and heat to areas of pain, if desired. Follow up with your primary care doctor for a recheck of symptoms if they persist. You may return to the ED for new or concerning symptoms.

## 2018-01-21 ENCOUNTER — Other Ambulatory Visit: Payer: Self-pay | Admitting: Family Medicine

## 2018-01-21 DIAGNOSIS — I10 Essential (primary) hypertension: Secondary | ICD-10-CM

## 2018-01-25 ENCOUNTER — Encounter (HOSPITAL_COMMUNITY): Payer: Self-pay

## 2018-01-25 ENCOUNTER — Other Ambulatory Visit: Payer: Self-pay

## 2018-01-25 ENCOUNTER — Emergency Department (HOSPITAL_COMMUNITY): Payer: BC Managed Care – PPO

## 2018-01-25 ENCOUNTER — Emergency Department (HOSPITAL_COMMUNITY)
Admission: EM | Admit: 2018-01-25 | Discharge: 2018-01-25 | Disposition: A | Discharge status: Home / Self Care | Payer: BC Managed Care – PPO | Attending: Emergency Medicine | Admitting: Emergency Medicine

## 2018-01-25 DIAGNOSIS — I10 Essential (primary) hypertension: Secondary | ICD-10-CM | POA: Diagnosis not present

## 2018-01-25 DIAGNOSIS — Z79899 Other long term (current) drug therapy: Secondary | ICD-10-CM | POA: Insufficient documentation

## 2018-01-25 DIAGNOSIS — E119 Type 2 diabetes mellitus without complications: Secondary | ICD-10-CM | POA: Diagnosis not present

## 2018-01-25 DIAGNOSIS — M62838 Other muscle spasm: Secondary | ICD-10-CM | POA: Diagnosis not present

## 2018-01-25 DIAGNOSIS — Z7984 Long term (current) use of oral hypoglycemic drugs: Secondary | ICD-10-CM | POA: Diagnosis not present

## 2018-01-25 DIAGNOSIS — J45909 Unspecified asthma, uncomplicated: Secondary | ICD-10-CM | POA: Insufficient documentation

## 2018-01-25 LAB — CBC WITH DIFFERENTIAL/PLATELET
Basophils Absolute: 0 10*3/uL (ref 0.0–0.1)
Basophils Relative: 0 %
EOS PCT: 4 %
Eosinophils Absolute: 0.4 10*3/uL (ref 0.0–0.7)
HCT: 38.6 % (ref 36.0–46.0)
Hemoglobin: 13 g/dL (ref 12.0–15.0)
LYMPHS ABS: 2.7 10*3/uL (ref 0.7–4.0)
LYMPHS PCT: 26 %
MCH: 29.8 pg (ref 26.0–34.0)
MCHC: 33.7 g/dL (ref 30.0–36.0)
MCV: 88.5 fL (ref 78.0–100.0)
MONO ABS: 0.5 10*3/uL (ref 0.1–1.0)
MONOS PCT: 4 %
Neutro Abs: 6.8 10*3/uL (ref 1.7–7.7)
Neutrophils Relative %: 66 %
PLATELETS: 316 10*3/uL (ref 150–400)
RBC: 4.36 MIL/uL (ref 3.87–5.11)
RDW: 14.1 % (ref 11.5–15.5)
WBC: 10.4 10*3/uL (ref 4.0–10.5)

## 2018-01-25 LAB — I-STAT CHEM 8, ED
BUN: 10 mg/dL (ref 6–20)
CALCIUM ION: 1.25 mmol/L (ref 1.15–1.40)
CHLORIDE: 106 mmol/L (ref 98–111)
Creatinine, Ser: 0.6 mg/dL (ref 0.44–1.00)
GLUCOSE: 103 mg/dL — AB (ref 70–99)
HCT: 38 % (ref 36.0–46.0)
HEMOGLOBIN: 12.9 g/dL (ref 12.0–15.0)
Potassium: 3.5 mmol/L (ref 3.5–5.1)
Sodium: 141 mmol/L (ref 135–145)
TCO2: 21 mmol/L — ABNORMAL LOW (ref 22–32)

## 2018-01-25 LAB — I-STAT BETA HCG BLOOD, ED (MC, WL, AP ONLY): I-stat hCG, quantitative: <5 m[IU]/mL (ref <5)

## 2018-01-25 LAB — I-STAT TROPONIN, ED: TROPONIN I, POC: 0 ng/mL (ref 0.00–0.08)

## 2018-01-25 MED ORDER — HYDROCHLOROTHIAZIDE 12.5 MG PO CAPS
12.5000 mg | ORAL_CAPSULE | Freq: Once | ORAL | Status: AC
  Administered 2018-01-25: 12.5 mg via ORAL
  Filled 2018-01-25: qty 1

## 2018-01-25 MED ORDER — LIDOCAINE 5 % EX PTCH: 1.0000 | MEDICATED_PATCH | CUTANEOUS | 0 refills | Status: AC

## 2018-01-25 MED ORDER — AMLODIPINE BESYLATE 5 MG PO TABS
5.0000 mg | ORAL_TABLET | Freq: Once | ORAL | Status: AC
  Administered 2018-01-25: 5 mg via ORAL
  Filled 2018-01-25: qty 1

## 2018-01-25 MED ORDER — LIDOCAINE 5 % EX PTCH
1.0000 | MEDICATED_PATCH | CUTANEOUS | Status: DC
  Administered 2018-01-25: 1 via TRANSDERMAL
  Filled 2018-01-25: qty 1

## 2018-01-25 MED ORDER — KETOROLAC TROMETHAMINE 60 MG/2ML IM SOLN
30.0000 mg | Freq: Once | INTRAMUSCULAR | Status: AC
  Administered 2018-01-25: 30 mg via INTRAMUSCULAR
  Filled 2018-01-25: qty 2

## 2018-01-25 NOTE — ED Triage Notes (Signed)
Pt presents to ED from home for HTN. PT reports that her PCP started her on 2 new medications for her BP within the last week. Pt reports that tonight she developed shoulder and neck pain, and thought her BP was probably high.

## 2018-01-25 NOTE — ED Provider Notes (Addendum)
[facility] DEPT Provider Note   CSN: [id number] Arrival date & time: 01/25/18  0116     History   Chief Complaint Chief Complaint  Patient presents with  . Hypertension    HPI April Duncan is a 42 y.o. female.  The history is provided by the patient.  Hypertension  The current episode started more than 1 week ago. The problem occurs constantly. The problem has been rapidly worsening. Pertinent negatives include no chest pain, no headaches and no shortness of breath. Nothing aggravates the symptoms. Nothing relieves the symptoms. Treatments tried: spironolactone and toprol  The treatment provided no relief.  Has chronic HTN and her PMD has been adjusting her medication and BP is still elevated.  Also has chronic shoulder pain and is not taking her medication as she is afraid it will increase he BP.  No DOE. No chest pain.  No SOB.  No n/v/d.  No weakness, nor numbness no changes in vision or speech.    Past Medical History:  Diagnosis Date  . Allergy   . Anemia   . Asthma   . Diabetes mellitus   . Hypertension   . PCOS (polycystic ovarian syndrome)   . 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 Tendonitis, Achilles, left 10/21/2014  . Morbid obesity with BMI of 50.0-59.9, adult ([place]) 03/12/2013  . Diabetes mellitus 08/19/2011  . Hypertension 08/19/2011  . PCOS (polycystic ovarian syndrome) 08/19/2011  . 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 CHOLECYSTECTOMY    . GALLBLADDER SURGERY    . HERNIA REPAIR    . TUBAL LIGATION       OB History   None      Home Medications    Prior to Admission medications   Medication Sig Start Date End Date Taking? Authorizing Provider  Cranberry 1000 MG CAPS Take 1,000 mg by mouth at bedtime.   Yes [provider]  fluticasone (FLONASE) 50 MCG/ACT nasal spray Place  1 spray into both nostrils daily.   Yes [provider]  metFORMIN (GLUCOPHAGE) 500 MG tablet Take 2,000 mg by mouth at bedtime.   Yes [provider]  metoprolol succinate (TOPROL-XL) 100 MG 24 hr tablet TAKE ONE TABLET BY MOUTH ONCE DAILY TAKE  WITH  OR  IMMEDIATELY  FOLLOWING  A  MEAL Patient taking differently: TAKE 100 MG BY MOUTH EVERY EVENING 12/15/15  Yes Tereasa Coop, PA-C  Multiple Vitamin (MULTIVITAMIN WITH MINERALS) TABS tablet Take 1 tablet by mouth daily.   Yes [provider]  olmesartan (BENICAR) 40 MG tablet Take 40 mg by mouth daily.   Yes [provider]  spironolactone (ALDACTONE) 25 MG tablet Take 25 mg by mouth daily.   Yes [provider]  vitamin A 10000 UNIT capsule Take 10,000 Units by mouth daily.   Yes [provider]  chlorthalidone (HYGROTON) 25 MG tablet TAKE ONE TABLET BY MOUTH ONCE DAILY Patient not taking: Reported on 01/25/2018 01/14/16   Darlyne Russian, MD  fexofenadine (ALLEGRA) 180 MG tablet Take 1 tablet (180 mg total) by mouth daily. Patient not taking: Reported on 01/25/2018 03/12/13   Duane Boston, MD  lidocaine (LIDODERM) 5 % Place 1 patch onto the skin daily. Remove & Discard patch within 12 hours or as directed by MD 01/25/18   Randal Buba, Ytzel Gubler, MD  lisinopril (PRINIVIL,ZESTRIL)  40 MG tablet Take 1 tablet (40 mg total) by mouth daily. Patient not taking: Reported on 01/25/2018 01/31/16   Tereasa Coop, PA-C  metFORMIN (GLUCOPHAGE XR) 500 MG 24 hr tablet Take 4 tablets (2,000 mg total) by mouth at bedtime. Patient not taking: Reported on 01/25/2018 07/07/15   Tereasa Coop, PA-C  naproxen (NAPROSYN) 500 MG tablet Take 1 tablet (500 mg total) by mouth 2 (two) times daily. Patient not taking: Reported on 01/25/2018 08/28/16   Antonietta Breach, PA-C  silver sulfADIAZINE (SILVADENE) 1 % cream Apply 1 application topically daily. Patient not taking: Reported on 01/25/2018 07/31/13   Robyn Haber, MD    triamcinolone cream (KENALOG) 0.1 % Apply topically 2 (two) times daily. Apply to dry skin on face Patient not taking: Reported on 01/25/2018 12/25/14   Robyn Haber, MD    Family History Family History  Problem Relation Age of Onset  . Hypertension Mother   . Depression Father   . Depression Brother   . Hypertension Maternal Grandmother   . Autoimmune disease Neg Hx     Social History Social History   Tobacco Use  . Smoking status: Never Smoker  . Smokeless tobacco: Never Used  Substance Use Topics  . Alcohol use: Yes    Alcohol/week: 0.6 oz    Types: 1 Standard drinks or equivalent per week  . Drug use: No     Allergies   Patient has no known allergies.   Review of Systems Review of Systems  Constitutional: Negative for diaphoresis.  Respiratory: Negative for chest tightness and shortness of breath.   Cardiovascular: Negative for chest pain, palpitations and leg swelling.  Genitourinary: Negative for flank pain.  Musculoskeletal: Positive for arthralgias.  Neurological: Negative for dizziness, tremors, seizures, syncope, facial asymmetry, speech difficulty, weakness, light-headedness, numbness and headaches.  All other systems reviewed and are negative.    Physical Exam Updated Vital Signs BP (!) 166/116   Pulse 72   Temp 98.1 F (36.7 C) (Oral)   Resp 19   Ht 5' 3"  (1.6 m)   Wt 120.7 kg (266 lb)   SpO2 100%   BMI 47.12 kg/m   Physical Exam  Constitutional: She is oriented to person, place, and time. She appears well-developed and well-nourished. No distress.  HENT:  Head: Normocephalic and atraumatic.  Mouth/Throat: Oropharynx is clear and moist. No oropharyngeal exudate.  Eyes: Pupils are equal, round, and reactive to light. Conjunctivae and EOM are normal.  Neck: Normal range of motion. Neck supple. No JVD present.  Cardiovascular: Normal rate, regular rhythm, normal heart sounds and intact distal pulses.  Pulmonary/Chest: Effort normal and  breath sounds normal. No stridor. She has no wheezes. She has no rales.  Abdominal: Soft. Bowel sounds are normal. She exhibits no mass. There is no tenderness. There is no rebound and no guarding.  Musculoskeletal: Normal range of motion. She exhibits no tenderness or deformity.  Neurological: She is alert and oriented to person, place, and time. She displays normal reflexes. No cranial nerve deficit or sensory deficit. She exhibits normal muscle tone. Coordination normal.  Skin: Skin is warm and dry. Capillary refill takes less than 2 seconds.  Psychiatric: She has a normal mood and affect.     ED Treatments / Results  Labs (all labs ordered are listed, but only abnormal results are displayed) Results for orders placed or performed during the hospital encounter of 01/25/18  CBC with Differential/Platelet  Result Value Ref Range   WBC 10.4 4.0 -  10.5 K/uL   RBC 4.36 3.87 - 5.11 MIL/uL   Hemoglobin 13.0 12.0 - 15.0 g/dL   HCT 38.6 36.0 - 46.0 %   MCV 88.5 78.0 - 100.0 fL   MCH 29.8 26.0 - 34.0 pg   MCHC 33.7 30.0 - 36.0 g/dL   RDW 14.1 11.5 - 15.5 %   Platelets 316 150 - 400 K/uL   Neutrophils Relative % 66 %   Neutro Abs 6.8 1.7 - 7.7 K/uL   Lymphocytes Relative 26 %   Lymphs Abs 2.7 0.7 - 4.0 K/uL   Monocytes Relative 4 %   Monocytes Absolute 0.5 0.1 - 1.0 K/uL   Eosinophils Relative 4 %   Eosinophils Absolute 0.4 0.0 - 0.7 K/uL   Basophils Relative 0 %   Basophils Absolute 0.0 0.0 - 0.1 K/uL  I-Stat Chem 8, ED  Result Value Ref Range   Sodium 141 135 - 145 mmol/L   Potassium 3.5 3.5 - 5.1 mmol/L   Chloride 106 98 - 111 mmol/L   BUN 10 6 - 20 mg/dL   Creatinine, Ser 0.60 0.44 - 1.00 mg/dL   Glucose, Bld 103 (H) 70 - 99 mg/dL   Calcium, Ion 1.25 1.15 - 1.40 mmol/L   TCO2 21 (L) 22 - 32 mmol/L   Hemoglobin 12.9 12.0 - 15.0 g/dL   HCT 38.0 36.0 - 46.0 %  I-stat troponin, ED  Result Value Ref Range   Troponin i, poc 0.00 0.00 - 0.08 ng/mL   Comment 3          I-Stat  Beta hCG blood, ED ([facility], [facility], [facility] only)  Result Value Ref Range   I-stat hCG, quantitative <5.0 <5 mIU/mL   Comment 3           Dg Chest 2 View  Result Date: 01/25/2018 CLINICAL DATA:  Chest pain and dyspnea for 3 weeks. EXAM: CHEST - 2 VIEW COMPARISON:  08/28/2016 FINDINGS: The heart size and mediastinal contours are within normal limits. Nonaneurysmal atherosclerotic aorta. Lung volumes are slightly low. No pulmonary consolidation or CHF. No effusion or pneumothorax. The visualized skeletal structures are unremarkable. IMPRESSION: No active cardiopulmonary disease.  Aortic atherosclerosis. Electronically Signed   By: Ashley Royalty M.D.   On: 01/25/2018 03:20    EKG EKG Interpretation  Date/Time:  Friday January 25 2018 02:38:10 EDT Ventricular Rate:  76 PR Interval:    QRS Duration: 107 QT Interval:  416 QTC Calculation: 468 R Axis:   -37 Text Interpretation:  Sinus rhythm Left ventricular hypertrophy Confirmed by Dory Horn) on 01/25/2018 2:41:33 AM   Radiology Dg Chest 2 View  Result Date: 01/25/2018 CLINICAL DATA:  Chest pain and dyspnea for 3 weeks. EXAM: CHEST - 2 VIEW COMPARISON:  08/28/2016 FINDINGS: The heart size and mediastinal contours are within normal limits. Nonaneurysmal atherosclerotic aorta. Lung volumes are slightly low. No pulmonary consolidation or CHF. No effusion or pneumothorax. The visualized skeletal structures are unremarkable. IMPRESSION: No active cardiopulmonary disease.  Aortic atherosclerosis. Electronically Signed   By: Ashley Royalty M.D.   On: 01/25/2018 03:20    Procedures Procedures (including critical care time)  Medications Ordered in ED Medications  lidocaine (LIDODERM) 5 % 1 patch (1 patch Transdermal Patch Applied 01/25/18 0301)  amLODipine (NORVASC) tablet 5 mg (5 mg Oral Given 01/25/18 0301)  ketorolac (TORADOL) injection 30 mg (30 mg Intramuscular Given 01/25/18 0415)  hydrochlorothiazide (MICROZIDE) capsule 12.5 mg (12.5 mg Oral Given  01/25/18 0415)  Final Clinical Impressions(s) / ED Diagnoses   Final diagnoses:  Muscle spasm  Essential hypertension   Follow up with your PMD for ongoing treatment of your PMD.    Return for pain, numbness, changes in vision or speech, fevers >100.4 unrelieved by medication, shortness of breath, intractable vomiting, or diarrhea, abdominal pain, Inability to tolerate liquids or food, cough, altered mental status or any concerns. No signs of systemic illness or infection. The patient is nontoxic-appearing on exam and vital signs are within normal limits. Will refer to urology for microscopy hematuria as patient is asymptomatic.  I have reviewed the triage vital signs and the nursing notes. Pertinent labs &imaging results that were available during my care of the patient were reviewed by me and considered in my medical decision making (see chart for details).  After history, exam, and medical workup I feel the patient has been appropriately medically screened and is safe for discharge home. Pertinent diagnoses were discussed with the patient. Patient was given return precautions. ED Discharge Orders        Ordered    lidocaine (LIDODERM) 5 %  Every 24 hours     01/25/18 0458       Jarvis Knodel, MD 01/25/18 Izora Gala, Trapper Meech, MD 01/25/18 [id number]

## 2018-01-31 ENCOUNTER — Ambulatory Visit
Admission: RE | Admit: 2018-01-31 | Discharge: 2018-01-31 | Disposition: A | Discharge status: Home / Self Care | Payer: BC Managed Care – PPO | Source: Ambulatory Visit | Attending: Family Medicine | Admitting: Family Medicine

## 2018-01-31 DIAGNOSIS — I10 Essential (primary) hypertension: Secondary | ICD-10-CM

## 2018-02-19 ENCOUNTER — Other Ambulatory Visit (HOSPITAL_BASED_OUTPATIENT_CLINIC_OR_DEPARTMENT_OTHER): Payer: Self-pay

## 2018-02-19 DIAGNOSIS — R0683 Snoring: Secondary | ICD-10-CM

## 2018-02-19 DIAGNOSIS — R5383 Other fatigue: Secondary | ICD-10-CM

## 2018-03-09 ENCOUNTER — Ambulatory Visit (HOSPITAL_BASED_OUTPATIENT_CLINIC_OR_DEPARTMENT_OTHER): Payer: BC Managed Care – PPO | Attending: Family Medicine | Admitting: Internal Medicine

## 2018-03-09 DIAGNOSIS — R0683 Snoring: Secondary | ICD-10-CM

## 2018-03-09 DIAGNOSIS — R5383 Other fatigue: Secondary | ICD-10-CM | POA: Insufficient documentation

## 2018-03-09 DIAGNOSIS — G4733 Obstructive sleep apnea (adult) (pediatric): Secondary | ICD-10-CM | POA: Insufficient documentation

## 2018-03-30 DIAGNOSIS — R0683 Snoring: Secondary | ICD-10-CM

## 2018-03-30 NOTE — Procedures (Signed)
Patient Name: April Duncan, April Duncan Date: 03/09/2018 Gender: Female D.O.B: [birth year] Age (years): 46 Referring Provider: Claud Kelp NP Height (inches): 61 Interpreting Physician: Baird Lyons MD, ABSM Weight (lbs): 270 RPSGT: Earney [place] BMI: 51 MRN: [id number] Neck Size: 16.00  CLINICAL INFORMATION Sleep Study Type: NPSG Indication for sleep study: Fatigue, Snoring  Epworth Sleepiness Score: 12  SLEEP STUDY TECHNIQUE As per the AASM Manual for the Scoring of Sleep and Associated Events v2.3 (April 2016) with a hypopnea requiring 4% desaturations.  The channels recorded and monitored were frontal, central and occipital EEG, electrooculogram (EOG), submentalis EMG (chin), nasal and oral airflow, thoracic and abdominal wall motion, anterior tibialis EMG, snore microphone, electrocardiogram, and pulse oximetry.  MEDICATIONS Medications self-administered by patient taken the night of the study : METOPROLOL, METFORMIN HYDROCHLORIDE ER, AMLEDIPINE, OLMESARTAN, SPIRONOLACTONE  SLEEP ARCHITECTURE The study was initiated at 10:04:49 PM and ended at 4:34:48 AM.  Sleep onset time was 6.2 minutes and the sleep efficiency was 83.4%%. The total sleep time was 325.2 minutes.  Stage REM latency was 176.0 minutes.  The patient spent 2.0%% of the night in stage N1 sleep, 79.7%% in stage N2 sleep, 0.0%% in stage N3 and 18.3% in REM.  Alpha intrusion was absent.  Supine sleep was 98.70%.  RESPIRATORY PARAMETERS The overall apnea/hypopnea index (AHI) was 8.5 per hour. There were 6 total apneas, including 6 obstructive, 0 central and 0 mixed apneas. There were 40 hypopneas and 7 RERAs.  The AHI during Stage REM sleep was 39.3 per hour.  AHI while supine was 8.6 per hour.  The mean oxygen saturation was 94.8%. The minimum SpO2 during sleep was 83.0%.  moderate snoring was noted during this study.  CARDIAC DATA The 2 lead EKG demonstrated sinus rhythm. The mean heart rate was  74.8 beats per minute. Other EKG findings include: None.  LEG MOVEMENT DATA The total PLMS were 0 with a resulting PLMS index of 0.0. Associated arousal with leg movement index was 0.0 .  IMPRESSIONS - Mild obstructive sleep apnea occurred during this study (AHI = 8.5/h). - No significant central sleep apnea occurred during this study (CAI = 0.0/h). - Mild oxygen desaturation was noted during this study (Min O2 = 83.0%). - The patient snored with moderate snoring volume. - No cardiac abnormalities were noted during this study. - Clinically significant periodic limb movements did not occur during sleep. No significant associated arousals.  DIAGNOSIS - Obstructive Sleep Apnea (327.23 [G47.33 ICD-10])  RECOMMENDATIONS - Treatment for mild OSA is directed at symptoms, with consideration of co-morbidities. Conservative management may include observation, weight loss, sleep position off back. Other options, including CPAP, a fitted oral appliance, or ENT evaluation, would be baased on clinical judgment. - Positional therapy avoiding supine position during sleep. - Be careful with alcohol, sedatives and other CNS depressants that may worsen sleep apnea and disrupt normal sleep architecture. - Sleep hygiene should be reviewed to assess factors that may improve sleep quality. - Weight management and regular exercise should be initiated or continued if appropriate.  [Electronically signed] 03/30/2018 12:58 PM  Baird Lyons MD, [place], American Board of Sleep Medicine   NPI: [id number]                          [place] [place] [facility]  ELECTRONICALLY SIGNED ON:  03/30/2018, 12:56 PM [place] PH: [phone]   FX: [phone] [place]  ACADEMY OF SLEEP MEDICINE

## 2018-05-03 ENCOUNTER — Ambulatory Visit (INDEPENDENT_AMBULATORY_CARE_PROVIDER_SITE_OTHER): Payer: BC Managed Care – PPO | Admitting: Pulmonary Disease

## 2018-05-03 ENCOUNTER — Encounter: Payer: Self-pay | Admitting: Pulmonary Disease

## 2018-05-03 VITALS — BP 114/76 | HR 73 | Ht 64.0 in | Wt 274.6 lb

## 2018-05-03 DIAGNOSIS — G4733 Obstructive sleep apnea (adult) (pediatric): Secondary | ICD-10-CM

## 2018-05-03 DIAGNOSIS — G4730 Sleep apnea, unspecified: Secondary | ICD-10-CM

## 2018-05-03 DIAGNOSIS — E669 Obesity, unspecified: Secondary | ICD-10-CM

## 2018-05-03 NOTE — Progress Notes (Signed)
   Subjective:    Patient ID: April Duncan, female    DOB: 29-Jul-1975, 42 y.o.   MRN: [id number]  HPI    Review of Systems  Constitutional: Negative for fever and unexpected weight change.  HENT: Negative for congestion, dental problem, ear pain, nosebleeds, postnasal drip, rhinorrhea, sinus pressure, sneezing, sore throat and trouble swallowing.   Eyes: Negative for redness and itching.  Respiratory: Negative for cough, chest tightness, shortness of breath and wheezing.   Cardiovascular: Negative for palpitations and leg swelling.  Gastrointestinal: Negative for nausea and vomiting.  Genitourinary: Negative for dysuria.  Musculoskeletal: Negative for joint swelling.  Skin: Negative for rash.  Neurological: Negative for headaches.  Hematological: Does not bruise/bleed easily.  Psychiatric/Behavioral: Negative for dysphoric mood. The patient is not nervous/anxious.        Objective:   Physical Exam        Assessment & Plan:

## 2018-05-03 NOTE — Patient Instructions (Signed)
Will arrange for CPAP set up  Follow up in 2 months after getting CPAP

## 2018-05-03 NOTE — Progress Notes (Signed)
[facility]  Chief Complaint  Patient presents with  . Sleep Consult    had a sleep study done and they told her she was dropping down to 80 percent when sleeping , mild sleep apnea     Constitutional:  BP 114/76 (BP Location: Right Arm, Cuff Size: Large)   Pulse 73   Ht 5' 4"  (1.626 m)   Wt 274 lb 9.6 oz (124.6 kg)   SpO2 99%   BMI 47.13 kg/m   Past Medical History:  Allergies, Asthma, DM, HTN, Polycystic ovary syndrome, Lt eye microaneurysm  Brief Summary:  April Duncan is a 42 y.o. female with obstructive sleep apnea.  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  It was determined that she snores and stops breathing at night.  She had sleep study in August.  Showed mild sleep apnea.  She always feels sleepy during the day.  She has trouble staying focused at work.  She goes to sleep at 10 pm.  She falls asleep quickly.  She wakes up 3 to 5 times to use the bathroom.  She gets out of bed at 5 am.  She feels tired in the morning.  She denies morning headache.  She takes benadryl at night sometimes to help sleep.  She drinks coffee and energy drinks to help stay awake.  She denies sleep walking, sleep talking, bruxism, or nightmares.  There is no history of restless legs.  She denies sleep hallucinations, sleep paralysis, or cataplexy.  The Epworth score is 12 out of 24.     Physical Exam:   Appearance - well kempt   ENMT - clear nasal mucosa, midline nasal  septum, no oral exudates, no LAN, trachea midline, MP 3  Respiratory - normal chest wall, normal respiratory effort, no accessory muscle use, no wheeze/rales  CV - s1s2 regular rate and rhythm, no murmurs, no peripheral edema, radial pulses symmetric  GI - soft, non tender, no masses  Lymph - no adenopathy noted in neck and axillary areas  MSK - normal gait  Ext - no cyanosis, clubbing, or joint inflammation noted  Skin - no rashes, lesions, or  ulcers  Neuro - normal strength, oriented x 3  Psych - normal mood and affect   Assessment/Plan:   Snoring with excessive daytime sleepiness from obstructive sleep apnea. -  various therapies for treatment were reviewed: CPAP, oral appliance, and surgical interventions - she is agreeable to try CPAP - will arrange for auto CPAP set up  Obesity. - discussed how weight can impact sleep and risk for sleep disordered breathing - discussed options to assist with weight loss: combination of diet modification, cardiovascular and strength training exercises  Cardiovascular risk. - had an extensive discussion regarding the adverse health consequences related to untreated sleep disordered breathing - specifically discussed the risks for hypertension, coronary artery disease, cardiac dysrhythmias, cerebrovascular disease, and diabetes - lifestyle modification discussed  Safe driving practices. - discussed how sleep disruption can increase risk of accidents, particularly when driving - safe driving practices were discussed    Patient Instructions  Will arrange for CPAP set up  Follow up in 2 months after getting CPAP    [name], MD [place] Pager: [phone] 05/03/2018, 3:50 PM  Flow Sheet    Sleep tests:  PSG 03/09/18 >> AHI 8.5, SpO2 low 83%  Review of Systems:  Constitutional: Negative for fever and unexpected weight change.  HENT: Negative for congestion, dental  problem, ear pain, nosebleeds, postnasal drip, rhinorrhea, sinus pressure, sneezing, sore throat and trouble swallowing.   Eyes: Negative for redness and itching.  Respiratory: Negative for cough, chest tightness, shortness of breath and wheezing.   Cardiovascular: Negative for palpitations and leg swelling.  Gastrointestinal: Negative for nausea and vomiting.  Genitourinary: Negative for dysuria.  Musculoskeletal: Negative for joint swelling.  Skin: Negative for rash.   Neurological: Negative for headaches.  Hematological: Does not bruise/bleed easily.  Psychiatric/Behavioral: Negative for dysphoric mood. The patient is not nervous/anxious.    Medications:   Allergies as of 05/03/2018   No Known Allergies     Medication List        Accurate as of 05/03/18  3:50 PM. Always use your most recent med list.          chlorthalidone 25 MG tablet Commonly known as:  HYGROTON TAKE ONE TABLET BY MOUTH ONCE DAILY   Cranberry 1000 MG Caps Take 1,000 mg by mouth at bedtime.   fexofenadine 180 MG tablet Commonly known as:  ALLEGRA Take 1 tablet (180 mg total) by mouth daily.   fluticasone 50 MCG/ACT nasal spray Commonly known as:  FLONASE Place 1 spray into both nostrils daily.   lidocaine 5 % Commonly known as:  LIDODERM Place 1 patch onto the skin daily. Remove & Discard patch within 12 hours or as directed by MD   lisinopril 40 MG tablet Commonly known as:  PRINIVIL,ZESTRIL Take 1 tablet (40 mg total) by mouth daily.   metFORMIN 500 MG tablet Commonly known as:  GLUCOPHAGE Take 2,000 mg by mouth at bedtime.   metFORMIN 500 MG 24 hr tablet Commonly known as:  GLUCOPHAGE-XR Take 4 tablets (2,000 mg total) by mouth at bedtime.   metoprolol succinate 100 MG 24 hr tablet Commonly known as:  TOPROL-XL TAKE ONE TABLET BY MOUTH ONCE DAILY TAKE  WITH  OR  IMMEDIATELY  FOLLOWING  A  MEAL   multivitamin with minerals Tabs tablet Take 1 tablet by mouth daily.   naproxen 500 MG tablet Commonly known as:  NAPROSYN Take 1 tablet (500 mg total) by mouth 2 (two) times daily.   olmesartan 40 MG tablet Commonly known as:  BENICAR Take 40 mg by mouth daily.   spironolactone 25 MG tablet Commonly known as:  ALDACTONE Take 25 mg by mouth daily.   triamcinolone cream 0.1 % Commonly known as:  KENALOG Apply topically 2 (two) times daily. Apply to dry skin on face   vitamin A 10000 UNIT capsule Take 10,000 Units by mouth daily.       Past  Surgical History:  She  has a past surgical history that includes Gallbladder surgery; Hernia repair; Tubal ligation; and Cholecystectomy.  Family History:  Her family history includes Depression in her brother and father; Hypertension in her maternal grandmother and mother.  Social History:  She  reports that she has never smoked. She has never used smokeless tobacco. She reports that she drinks about 1.0 standard drinks of alcohol per week. She reports that she does not use drugs.

## 2018-06-26 ENCOUNTER — Encounter: Payer: Self-pay | Admitting: Pulmonary Disease

## 2018-06-26 ENCOUNTER — Ambulatory Visit: Payer: BC Managed Care – PPO | Admitting: Pulmonary Disease

## 2018-06-26 VITALS — BP 122/86 | HR 94 | Ht 64.0 in | Wt 270.0 lb

## 2018-06-26 DIAGNOSIS — G4733 Obstructive sleep apnea (adult) (pediatric): Secondary | ICD-10-CM

## 2018-06-26 DIAGNOSIS — G4730 Sleep apnea, unspecified: Secondary | ICD-10-CM

## 2018-06-26 DIAGNOSIS — E669 Obesity, unspecified: Secondary | ICD-10-CM | POA: Diagnosis not present

## 2018-06-26 NOTE — Progress Notes (Signed)
[facility]  Chief Complaint  Patient presents with  . Follow-up    Pt doing well overall with cpap machine.    Constitutional:  BP 122/86 (BP Location: Left Arm, Cuff Size: Normal)   Pulse 94   Ht 5' 4"  (1.626 m)   Wt 270 lb (122.5 kg)   SpO2 98%   BMI 46.35 kg/m   Past Medical History:  Allergies, Asthma, DM, HTN, Polycystic ovary syndrome, Lt eye microaneurysm  Brief Summary:  April Duncan is a 42 y.o. female with obstructive sleep apnea.  She has been doing well with CPAP.  No issues with mask fit.  Gets occasional dry mouth.  Sleeping better and feels more alert during the day.  Physical Exam:   Appearance - well kempt   ENMT - no sinus tenderness, no nasal discharge, no oral exudate, MP 3  Neck - no masses, trachea midline, no thyromegaly, no elevation in JVP  Respiratory - normal appearance of chest wall, normal respiratory effort w/o accessory muscle use, no dullness on percussion, no tactile fremitus, no wheezing or rales  CV - s1s2 regular rate and rhythm, no murmurs, no peripheral edema, no varicosities, radial pulses symmetric  GI - soft, non tender, no masses, no hepatosplenomegaly  Lymph - no adenopathy noted in neck and axillary areas  MSK - normal gait  Ext - no cyanosis, clubbing, or joint inflammation noted  Skin - no rashes, lesions, or ulcers  Neuro - normal strength, oriented x 3  Psych - normal mood and affect  Assessment/Plan:   Obstructive sleep apnea. - she is compliant with CPAP and reports benefit - continue auto CPAP - will call her with download results  Obesity. - discussed importance of weight loss   Patient Instructions  Will call with results of CPAP report  Follow up in 1 year    [name], MD [place] Pager: [phone] 06/26/2018, 4:59 PM  Flow Sheet    Sleep tests:  PSG 03/09/18 >> AHI 8.5, SpO2 low 83%   Medications:   Allergies  as of 06/26/2018   No Known Allergies     Medication List       Accurate as of June 26, 2018  4:59 PM. Always use your most recent med list.        chlorthalidone 25 MG tablet Commonly known as:  HYGROTON TAKE ONE TABLET BY MOUTH ONCE DAILY   Cranberry 1000 MG Caps Take 1,000 mg by mouth at bedtime.   fexofenadine 180 MG tablet Commonly known as:  ALLEGRA Take 1 tablet (180 mg total) by mouth daily.   fluticasone 50 MCG/ACT nasal spray Commonly known as:  FLONASE Place 1 spray into both nostrils daily.   lidocaine 5 % Commonly known as:  LIDODERM Place 1 patch onto the skin daily. Remove & Discard patch within 12 hours or as directed by MD   lisinopril 40 MG tablet Commonly known as:  PRINIVIL,ZESTRIL Take 1 tablet (40 mg total) by mouth daily.   metFORMIN 500 MG tablet Commonly known as:  GLUCOPHAGE Take 2,000 mg by mouth at bedtime.   metFORMIN 500 MG 24 hr tablet Commonly known as:  GLUCOPHAGE XR Take 4 tablets (2,000 mg total) by mouth at bedtime.   metoprolol succinate 100 MG 24 hr tablet Commonly known as:  TOPROL-XL TAKE ONE TABLET BY MOUTH ONCE DAILY TAKE  WITH  OR  IMMEDIATELY  FOLLOWING  A  MEAL   multivitamin with  minerals Tabs tablet Take 1 tablet by mouth daily.   naproxen 500 MG tablet Commonly known as:  NAPROSYN Take 1 tablet (500 mg total) by mouth 2 (two) times daily.   olmesartan 40 MG tablet Commonly known as:  BENICAR Take 40 mg by mouth daily.   spironolactone 25 MG tablet Commonly known as:  ALDACTONE Take 25 mg by mouth daily.   triamcinolone cream 0.1 % Commonly known as:  KENALOG Apply topically 2 (two) times daily. Apply to dry skin on face   vitamin A 10000 UNIT capsule Take 10,000 Units by mouth daily.       Past Surgical History:  She  has a past surgical history that includes Gallbladder surgery; Hernia repair; Tubal ligation; and Cholecystectomy.  Family History:  Her family history includes Depression in  her brother and father; Hypertension in her maternal grandmother and mother.  Social History:  She  reports that she has never smoked. She has never used smokeless tobacco. She reports current alcohol use of about 1.0 standard drinks of alcohol per week. She reports that she does not use drugs.

## 2018-06-26 NOTE — Patient Instructions (Signed)
Will call with results of CPAP report  Follow up in 1 year

## 2018-06-27 ENCOUNTER — Telehealth: Payer: Self-pay | Admitting: Pulmonary Disease

## 2018-06-27 NOTE — Telephone Encounter (Signed)
Went to [employer] and did print a Bank[employer] on pt's cpap info.  Will give the download to VS for him to view. Routing this message to Dr. Halford Chessman.

## 2018-06-27 NOTE — Telephone Encounter (Signed)
Auto CPAP 05/28/18 to 06/26/18 >> used on 29 of 30 nights with average 6 hrs 3 min.  Average AHI 1.6 with median CPAP 7 and 95 th percentile CPAP 12 cm H2O.   Please let her know that CPAP report shows good control of sleep apnea with current settings.  She is listed as April Duncan in [place].

## 2018-06-28 NOTE — Telephone Encounter (Signed)
Called and spoke with patient regarding results.  Informed the patient of results and recommendations today. Pt verbalized understanding and denied any questions or concerns at this time.  Nothing further needed.  

## 2020-02-18 DIAGNOSIS — Z7984 Long term (current) use of oral hypoglycemic drugs: Secondary | ICD-10-CM | POA: Insufficient documentation

## 2020-02-18 DIAGNOSIS — I10 Essential (primary) hypertension: Secondary | ICD-10-CM | POA: Insufficient documentation

## 2020-02-18 DIAGNOSIS — E119 Type 2 diabetes mellitus without complications: Secondary | ICD-10-CM | POA: Diagnosis not present

## 2020-02-18 DIAGNOSIS — R1030 Lower abdominal pain, unspecified: Secondary | ICD-10-CM | POA: Diagnosis present

## 2020-02-18 DIAGNOSIS — N3000 Acute cystitis without hematuria: Secondary | ICD-10-CM | POA: Insufficient documentation

## 2020-02-18 DIAGNOSIS — J45909 Unspecified asthma, uncomplicated: Secondary | ICD-10-CM | POA: Insufficient documentation

## 2020-02-18 DIAGNOSIS — Z79899 Other long term (current) drug therapy: Secondary | ICD-10-CM | POA: Insufficient documentation

## 2020-02-19 ENCOUNTER — Other Ambulatory Visit: Payer: Self-pay

## 2020-02-19 ENCOUNTER — Encounter (HOSPITAL_COMMUNITY): Payer: Self-pay | Admitting: Emergency Medicine

## 2020-02-19 ENCOUNTER — Emergency Department (HOSPITAL_COMMUNITY)
Admission: EM | Admit: 2020-02-19 | Discharge: 2020-02-19 | Disposition: A | Discharge status: Home / Self Care | Payer: BC Managed Care – PPO | Attending: Emergency Medicine | Admitting: Emergency Medicine

## 2020-02-19 ENCOUNTER — Emergency Department (HOSPITAL_COMMUNITY): Payer: BC Managed Care – PPO

## 2020-02-19 DIAGNOSIS — N3000 Acute cystitis without hematuria: Secondary | ICD-10-CM

## 2020-02-19 LAB — CBC
HCT: 36.5 % (ref 36.0–46.0)
Hemoglobin: 12.1 g/dL (ref 12.0–15.0)
MCH: 29.4 pg (ref 26.0–34.0)
MCHC: 33.2 g/dL (ref 30.0–36.0)
MCV: 88.8 fL (ref 80.0–100.0)
Platelets: 307 10*3/uL (ref 150–400)
RBC: 4.11 MIL/uL (ref 3.87–5.11)
RDW: 13.7 % (ref 11.5–15.5)
WBC: 9.9 10*3/uL (ref 4.0–10.5)
nRBC: 0 % (ref 0.0–0.2)

## 2020-02-19 LAB — URINALYSIS, ROUTINE W REFLEX MICROSCOPIC
Bilirubin Urine: NEGATIVE
Glucose, UA: NEGATIVE mg/dL
Hgb urine dipstick: NEGATIVE
Ketones, ur: NEGATIVE mg/dL
Nitrite: NEGATIVE
Protein, ur: NEGATIVE mg/dL
Specific Gravity, Urine: 1.012 (ref 1.005–1.030)
pH: 5 (ref 5.0–8.0)

## 2020-02-19 LAB — BASIC METABOLIC PANEL
Anion gap: 9 (ref 5–15)
BUN: 14 mg/dL (ref 6–20)
CO2: 21 mmol/L — ABNORMAL LOW (ref 22–32)
Calcium: 9.1 mg/dL (ref 8.9–10.3)
Chloride: 105 mmol/L (ref 98–111)
Creatinine, Ser: 0.75 mg/dL (ref 0.44–1.00)
GFR calc Af Amer: >60 mL/min (ref >60)
GFR calc non Af Amer: >60 mL/min (ref >60)
Glucose, Bld: 116 mg/dL — ABNORMAL HIGH (ref 70–99)
Potassium: 4.2 mmol/L (ref 3.5–5.1)
Sodium: 135 mmol/L (ref 135–145)

## 2020-02-19 LAB — I-STAT BETA HCG BLOOD, ED (NOT ORDERABLE): I-stat hCG, quantitative: <5 m[IU]/mL (ref <5)

## 2020-02-19 MED ORDER — ONDANSETRON 4 MG PO TBDP: 4.0000 mg | ORAL_TABLET | Freq: Three times a day (TID) | ORAL | 0 refills | Status: AC | PRN

## 2020-02-19 MED ORDER — SODIUM CHLORIDE 0.9 % IV BOLUS
1000.0000 mL | Freq: Once | INTRAVENOUS | Status: AC
  Administered 2020-02-19: 1000 mL via INTRAVENOUS

## 2020-02-19 MED ORDER — SODIUM CHLORIDE 0.9 % IV SOLN
1.0000 g | Freq: Once | INTRAVENOUS | Status: AC
  Administered 2020-02-19: 1 g via INTRAVENOUS
  Filled 2020-02-19: qty 10

## 2020-02-19 MED ORDER — ONDANSETRON HCL 4 MG/2ML IJ SOLN
4.0000 mg | Freq: Once | INTRAMUSCULAR | Status: AC
  Administered 2020-02-19: 4 mg via INTRAVENOUS
  Filled 2020-02-19: qty 2

## 2020-02-19 MED ORDER — MORPHINE SULFATE (PF) 4 MG/ML IV SOLN
4.0000 mg | Freq: Once | INTRAVENOUS | Status: AC
Start: 2020-02-19 — End: 2020-02-19
  Administered 2020-02-19: 4 mg via INTRAVENOUS
  Filled 2020-02-19: qty 1

## 2020-02-19 MED ORDER — MORPHINE SULFATE (PF) 4 MG/ML IV SOLN
6.0000 mg | Freq: Once | INTRAVENOUS | Status: AC
  Administered 2020-02-19: 6 mg via INTRAVENOUS
  Filled 2020-02-19: qty 2

## 2020-02-19 MED ORDER — CEPHALEXIN 500 MG PO CAPS: 500.0000 mg | ORAL_CAPSULE | Freq: Four times a day (QID) | ORAL | 0 refills | Status: AC

## 2020-02-19 NOTE — ED Notes (Signed)
Assumed care of pt at this time. Pt resting in stretcher, no s/sx of acute distress at this time.

## 2020-02-19 NOTE — Discharge Instructions (Addendum)
Thank you for allowing me to care for you today in the Emergency Department.   Your presentation today was consistent with a urinary tract infection.  Your CT scan does show that you have a kidney stone, but it is located within the right kidney and should not be contributing to your symptoms. Your tubal ligation clips are displaced.  Discussed this with your OBGYN.  Take 1 tablet of Keflex every 6 hours for the next week.  Take 650 mg of Tylenol or 600 mg of ibuprofen with food every 6 hours for pain.  You can alternate between these 2 medications every 3 hours if your pain returns.  For instance, you can take Tylenol at noon, followed by a dose of ibuprofen at 3, followed by second dose of Tylenol and 6.  Let 1 tablet of Zofran dissolve in your tongue every 8 hours as needed for nausea and vomiting.  Follow-up with primary care if your symptoms do not significantly improve in the next 4 to 5 days.  Return to the emergency department if you develop uncontrollable pain despite following the regimen above, uncontrollable vomiting, if you become unable to urinate, or other new, concerning symptoms.

## 2020-02-19 NOTE — ED Provider Notes (Signed)
[facility] DEPT Provider Note   CSN: [id number] Arrival date & time: 02/18/20  2351     History Chief Complaint  Patient presents with  . Abdominal Pain  . Flank Pain    April Duncan is a 44 y.o. female with a history of diabetes mellitus, PCOS, and hypertension who presents the emergency department with a chief complaint of bilateral lower abdominal pain and bilateral flank pain, right greater than left, that began 2 days ago.  She reports the pain has been constant and worsening since onset.  No known aggravating or alleviating factors.  She is unable to characterize the pain.  She has been taking NSAIDs at home without improvement of her symptoms.  Despite treating her symptoms at home, her pain continues to be a 10 out of 10.  States the pain feels worse than childbirth.  She denies hematuria, dysuria, fever, chills, nausea, vomiting, vaginal bleeding, vaginal discharge, constipation, chest pain, shortness of breath, or cough.  Reports that she was having loose stools last week, but denies diarrhea today.  Reports that she reached out to her PCP regarding her symptoms and was called in a prescription of muscle relaxers, which she attempted to take for her symptoms, but reports the medication provided no relief.  She has a history of kidney stones.  Surgical history includes cholecystectomy, hernia repair, and tubal ligation.  The history is provided by the patient. No language interpreter was used.       Past Medical History:  Diagnosis Date  . Allergy   . Anemia   . Asthma   . Diabetes mellitus   . Hypertension   . PCOS (polycystic ovarian syndrome)   . 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 Tendonitis, Achilles, left 10/21/2014  . Morbid obesity with BMI of 50.0-59.9, adult ([place]) 03/12/2013  . Diabetes mellitus 08/19/2011  . Hypertension 08/19/2011  . PCOS (polycystic  ovarian syndrome) 08/19/2011  . 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 CHOLECYSTECTOMY    . GALLBLADDER SURGERY    . HERNIA REPAIR    . TUBAL LIGATION       OB History   No obstetric history on file.     Family History  Problem Relation Age of Onset  . Hypertension Mother   . Depression Father   . Depression Brother   . Hypertension Maternal Grandmother   . Autoimmune disease Neg Hx     Social History   Tobacco Use  . Smoking status: Never Smoker  . Smokeless tobacco: Never Used  Substance Use Topics  . Alcohol use: Yes    Alcohol/week: 1.0 standard drink    Types: 1 Standard drinks or equivalent per week  . Drug use: No    Home Medications Prior to Admission medications   Medication Sig Start Date End Date Taking? Authorizing Provider  cephALEXin (KEFLEX) 500 MG capsule Take 1 capsule (500 mg total) by mouth 4 (four) times daily for 7 days. 02/19/20 02/26/20  Taytem Ghattas A, PA-C  chlorthalidone (HYGROTON) 25 MG tablet TAKE ONE TABLET BY MOUTH ONCE DAILY 01/14/16   Darlyne Russian, MD  Cranberry 1000 MG CAPS Take 1,000 mg by mouth at bedtime.    [provider]  fexofenadine (ALLEGRA) 180 MG tablet Take 1 tablet (180 mg total) by mouth daily. 03/12/13   Duane Boston, MD  fluticasone (FLONASE) 50 MCG/ACT nasal spray Place 1 spray into both nostrils daily.    [provider]  lidocaine (LIDODERM) 5 % Place 1 patch onto the skin daily. Remove & Discard patch within 12 hours or as directed by MD 01/25/18   Randal Buba, April, MD  lisinopril (PRINIVIL,ZESTRIL) 40 MG tablet Take 1 tablet (40 mg total) by mouth daily. 01/31/16   Tereasa Coop, PA-C  metFORMIN (GLUCOPHAGE XR) 500 MG 24 hr tablet Take 4 tablets (2,000 mg total) by mouth at bedtime. 07/07/15   Tereasa Coop, PA-C  metFORMIN (GLUCOPHAGE) 500 MG tablet Take 2,000 mg by mouth at bedtime.    [provider]  metoprolol succinate  (TOPROL-XL) 100 MG 24 hr tablet TAKE ONE TABLET BY MOUTH ONCE DAILY TAKE  WITH  OR  IMMEDIATELY  FOLLOWING  A  MEAL Patient taking differently: TAKE 100 MG BY MOUTH EVERY EVENING 12/15/15   Tereasa Coop, PA-C  Multiple Vitamin (MULTIVITAMIN WITH MINERALS) TABS tablet Take 1 tablet by mouth daily.    [provider]  naproxen (NAPROSYN) 500 MG tablet Take 1 tablet (500 mg total) by mouth 2 (two) times daily. 08/28/16   Antonietta Breach, PA-C  olmesartan (BENICAR) 40 MG tablet Take 40 mg by mouth daily.    [provider]  ondansetron (ZOFRAN ODT) 4 MG disintegrating tablet Take 1 tablet (4 mg total) by mouth every 8 (eight) hours as needed. 02/19/20   Berklie Dethlefs A, PA-C  spironolactone (ALDACTONE) 25 MG tablet Take 25 mg by mouth daily.    [provider]  triamcinolone cream (KENALOG) 0.1 % Apply topically 2 (two) times daily. Apply to dry skin on face 12/25/14   Robyn Haber, MD  vitamin A 10000 UNIT capsule Take 10,000 Units by mouth daily.    [provider]    Allergies    Patient has no known allergies.  Review of Systems   Review of Systems  Constitutional: Negative for activity change, chills and fever.  Respiratory: Negative for shortness of breath and wheezing.   Cardiovascular: Negative for chest pain and palpitations.  Gastrointestinal: Positive for abdominal pain. Negative for constipation, diarrhea, nausea and vomiting.  Genitourinary: Positive for flank pain. Negative for dysuria, vaginal bleeding, vaginal discharge and vaginal pain.  Musculoskeletal: Negative for back pain, myalgias, neck pain and neck stiffness.  Skin: Negative for rash.  Allergic/Immunologic: Negative for immunocompromised state.  Neurological: Negative for dizziness, seizures, syncope, weakness, numbness and headaches.  Psychiatric/Behavioral: Negative for confusion.    Physical Exam Updated Vital Signs BP 123/83 (BP Location: Left Arm)   Pulse 84   Temp 98.2 F  (36.8 C) (Oral)   Resp 18   Ht 5' 4"  (1.626 m)   Wt 132.9 kg   SpO2 99%   BMI 50.29 kg/m   Physical Exam Vitals and nursing note reviewed.  Constitutional:      General: She is not in acute distress.    Appearance: She is not ill-appearing, toxic-appearing or diaphoretic.     Comments: Well appearing. NAD.  HENT:     Head: Normocephalic.  Eyes:     Conjunctiva/sclera: Conjunctivae normal.  Cardiovascular:     Rate and Rhythm: Normal rate and regular rhythm.     Heart sounds: No murmur heard.  No friction rub. No gallop.   Pulmonary:     Effort: Pulmonary effort is normal. No respiratory distress.     Breath sounds: No stridor. No wheezing, rhonchi or rales.  Chest:  Chest wall: No tenderness.  Abdominal:     General: There is no distension.     Palpations: Abdomen is soft. There is no mass.     Tenderness: There is abdominal tenderness. There is right CVA tenderness and left CVA tenderness. There is no guarding or rebound.     Hernia: No hernia is present.     Comments: Tender to palpation in the suprapubic region and right lower quadrant.  There is bilateral CVA tenderness, right greater than left.  Abdomen is soft and nondistended.  Normoactive bowel sounds.  Musculoskeletal:     Cervical back: Neck supple.     Right lower leg: No edema.     Left lower leg: No edema.  Skin:    General: Skin is warm.     Findings: No rash.  Neurological:     Mental Status: She is alert.  Psychiatric:        Behavior: Behavior normal.     ED Results / Procedures / Treatments   Labs (all labs ordered are listed, but only abnormal results are displayed) Labs Reviewed  URINALYSIS, ROUTINE W REFLEX MICROSCOPIC - Abnormal; Notable for the following components:      Result Value   APPearance HAZY (*)    Leukocytes,Ua LARGE (*)    Bacteria, UA MANY (*)    All other components within normal limits  BASIC METABOLIC PANEL - Abnormal; Notable for the following components:   CO2 21  (*)    Glucose, Bld 116 (*)    All other components within normal limits  URINE CULTURE  CBC  I-STAT BETA HCG BLOOD, ED ([facility], [facility], [facility] ONLY)  I-STAT BETA HCG BLOOD, ED (NOT ORDERABLE)    EKG None  Radiology CT Renal Stone Study  Result Date: 02/19/2020 CLINICAL DATA:  44 year old female with flank and abdominal pain for 2 days. EXAM: CT ABDOMEN AND PELVIS WITHOUT CONTRAST TECHNIQUE: Multidetector CT imaging of the abdomen and pelvis was performed following the standard protocol without IV contrast. COMPARISON:  CTA chest 11/04/2011. FINDINGS: Lower chest: Negative. Hepatobiliary: Absent gallbladder. Probable hepatic steatosis. Otherwise negative noncontrast liver. Pancreas: Negative. Spleen: Negative. Adrenals/Urinary Tract: Normal adrenal glands. Mild congenital right renal malrotation, normal variant. 3-4 mm right renal calculus seen on coronal image 90. No left nephrolithiasis. Tiny exophytic left renal cysts suspected from the lower pole. No hydronephrosis and both ureters are decompressed and normal to the urinary bladder. Diminutive and unremarkable bladder. Stomach/Bowel: Negative rectum. Diverticulosis throughout the sigmoid colon with no active inflammation. Negative upstream colon aside from retained stool. Diminutive retrocecal appendix. Negative terminal ileum. No dilated small bowel. Unremarkable stomach and duodenum. No free air, free fluid, mesenteric stranding. Vascular/Lymphatic: Normal caliber aorta. Vascular patency is not evaluated in the absence of IV contrast. No lymphadenopathy. Reproductive: 2 tubal ligation clips are identified, although neither appears to be attached to a fallopian tube (series 2, images 75 and 66). IUD also in place. Other: No pelvic free fluid. Musculoskeletal: No acute osseous abnormality identified. IMPRESSION: 1. Right nephrolithiasis but no obstructive uropathy. 2. No acute or inflammatory process identified in the non-contrast abdomen or pelvis. Displaced  tubal ligation clips, although superimposed IUD in place. Sigmoid diverticulosis without active inflammation. Probable hepatic steatosis. Electronically Signed   By: Genevie Ann M.D.   On: 02/19/2020 02:03    Procedures Procedures (including critical care time)  Medications Ordered in ED Medications  ondansetron (ZOFRAN) injection 4 mg (has no administration in time range)  morphine 4 MG/ML injection  4 mg (has no administration in time range)  sodium chloride 0.9 % bolus 1,000 mL (has no administration in time range)  cefTRIAXone (ROCEPHIN) 1 g in sodium chloride 0.9 % 100 mL IVPB (has no administration in time range)    ED Course  I have reviewed the triage vital signs and the nursing notes.  Pertinent labs & imaging results that were available during my care of the patient were reviewed by me and considered in my medical decision making (see chart for details).    MDM Rules/Calculators/A&P                          44 year old female with a history of diabetes mellitus, PCOS, and hypertension who presents the emergency department with a chief complaint of abdominal and flank pain.  No constitutional symptoms.  Vital signs are reassuring.  UA concerning for infection.  Urine culture sent.  Amorphous crystals seen on UA and CT stone study was obtained, which demonstrates a stone in the right kidney.  No leukocytosis.  No electrolyte derangements.  No low suspicion for PID, diverticulitis, appendicitis, pancreatitis.  Will give Rocephin for UTI with possible developing pyelonephritis.  We will plan to discharge the patient with Keflex after previous cultures and sensitivities are available.  Patient care transferred to [facility] at the end of my shift to recheck the patient for improvement in her symptoms. Patient presentation, ED course, and plan of care discussed with review of all pertinent labs and imaging. Please see his/her note for further details regarding further ED course and  disposition.  Final Clinical Impression(s) / ED Diagnoses Final diagnoses:  Acute cystitis without hematuria    Rx / DC Orders ED Discharge Orders         Ordered    cephALEXin (KEFLEX) 500 MG capsule  4 times daily     Discontinue  Reprint     02/19/20 0619    ondansetron (ZOFRAN ODT) 4 MG disintegrating tablet  Every 8 hours PRN     Discontinue  Reprint     02/19/20 0620           Rosie Golson A, PA-C 02/19/20 0700    Long, Wonda Olds, MD 02/20/20 [phone]

## 2020-02-19 NOTE — ED Notes (Signed)
Pt discharged from this ED in stable condition at this time. All discharge instructions and follow up care reviewed with pt with no further questions at this time. Pt ambulatory with steady gait, clear speech.  

## 2020-02-19 NOTE — ED Notes (Signed)
Pt c/o worsening pain, states it is now epigastric. Provider made aware.

## 2020-02-19 NOTE — ED Triage Notes (Signed)
Patient complaining of back pain that is radiating to her lower abdominal area. Patient states this started yesterday afternoon.

## 2020-02-21 LAB — URINE CULTURE: Culture: >=100000 — AB

## 2020-02-22 ENCOUNTER — Telehealth: Payer: Self-pay | Admitting: Emergency Medicine

## 2020-02-22 NOTE — Telephone Encounter (Signed)
Post ED Visit - Positive Culture Follow-up  Culture report reviewed by antimicrobial stewardship pharmacist: [place] Team []  Elenor Quinones, Pharm.D. []  Heide Guile, Pharm.D., BCPS AQ-ID []  Parks Neptune, Pharm.D., BCPS []  Alycia Rossetti, Pharm.D., BCPS []  [place], Pharm.D., BCPS, AAHIVP []  Legrand Como, Pharm.D., BCPS, AAHIVP []  Salome Arnt, PharmD, BCPS []  Johnnette Gourd, PharmD, BCPS []  Hughes Better, PharmD, BCPS []  Leeroy Cha, PharmD []  Laqueta [place], PharmD, BCPS []  Albertina Parr, PharmD  [place] Team [x]  Leodis Sias, PharmD []  [place] Spar, PharmD []  Royetta Asal, PharmD []  Graylin Shiver, Rph []  Rema Fendt) Glennon Mac, PharmD []  Arlyn Dunning, PharmD []  Netta Cedars, PharmD []  Dia Sitter, PharmD []  Leone Haven, PharmD []  Gretta Arab, PharmD []  Theodis Shove, PharmD []  Peggyann Juba, PharmD []  Reuel Boom, PharmD   Positive urine culture Treated with Cephalexin, organism sensitive to the same and no further patient follow-up is required at this time.  April Duncan 02/22/2020, 3:16 PM

## 2020-02-22 NOTE — Progress Notes (Signed)
ED Antimicrobial Stewardship Positive Culture Follow Up   April Duncan is an 44 y.o. female who presented to [facility] on 02/19/2020 with a chief complaint of  Chief Complaint  Patient presents with  . Abdominal Pain  . Flank Pain    Recent Results (from the past 720 hour(s))  Urine culture     Status: Abnormal   Collection Time: 02/19/20  1:10 AM   Specimen: Urine, Clean Catch  Result Value Ref Range Status   Specimen Description   Final    URINE, CLEAN CATCH Performed at [facility], [place] [address] [place], [place] [address]    Special Requests   Final    NONE Performed at [facility], [place] [address] [place], [place] [address]    Culture (A)  Final    >=100,000 COLONIES/mL ESCHERICHIA COLI 20,000 COLONIES/mL ENTEROBACTER AEROGENES CULTURE REINCUBATED FOR BETTER GROWTH Performed at [facility], [place] [address] [place], [place] [address]    Report Status 02/21/2020 FINAL  Final   Organism ID, Bacteria ESCHERICHIA COLI (A)  Final   Organism ID, Bacteria ENTEROBACTER AEROGENES (A)  Final      Susceptibility   Enterobacter aerogenes - MIC*    CEFAZOLIN >=64 RESISTANT Resistant     CEFTRIAXONE <=0.25 SENSITIVE Sensitive     CIPROFLOXACIN <=0.25 SENSITIVE Sensitive     GENTAMICIN <=1 SENSITIVE Sensitive     IMIPENEM 2 SENSITIVE Sensitive     NITROFURANTOIN 64 INTERMEDIATE Intermediate     TRIMETH/SULFA <=20 SENSITIVE Sensitive     PIP/TAZO <=4 SENSITIVE Sensitive     * 20,000 COLONIES/mL ENTEROBACTER AEROGENES   Escherichia coli - MIC*    AMPICILLIN <=2 SENSITIVE Sensitive     CEFAZOLIN <=4 SENSITIVE Sensitive     CEFTRIAXONE <=0.25 SENSITIVE Sensitive     CIPROFLOXACIN <=0.25 SENSITIVE Sensitive     GENTAMICIN <=1 SENSITIVE Sensitive     IMIPENEM <=0.25 SENSITIVE Sensitive     NITROFURANTOIN <=16 SENSITIVE Sensitive     TRIMETH/SULFA <=20 SENSITIVE Sensitive     AMPICILLIN/SULBACTAM <=2 SENSITIVE Sensitive      PIP/TAZO <=4 SENSITIVE Sensitive     * >=100,000 COLONIES/mL ESCHERICHIA COLI    [x]  Treated with keflex> covers E coli. No need to treat 20 K enterobacter (covered by Rocephin given in ED)  []  Patient discharged originally without antimicrobial agent and treatment is now indicated  ED Provider:  Benny Lennert T 02/22/2020, 11:12 AM Clinical Pharmacist [phone]

## 2020-08-23 IMAGING — CT CT RENAL STONE PROTOCOL
2 of 4 series · 16 of 46 positions shown, 18 images · non-contrast
Comparison: CTA chest 11/04/2011.

CLINICAL DATA: 44-year-old female with flank and abdominal pain for
2 days.

EXAM:
CT ABDOMEN AND PELVIS WITHOUT CONTRAST
TECHNIQUE: Multidetector CT imaging of the abdomen and pelvis was performed
following the standard protocol without IV contrast.

[Series 2: axial st · axial · 0.76mm/px · z∈[+1093,+1498]mm · 13 of 91 slices shown, 15 images]
[im 5/91  soft-tissue]
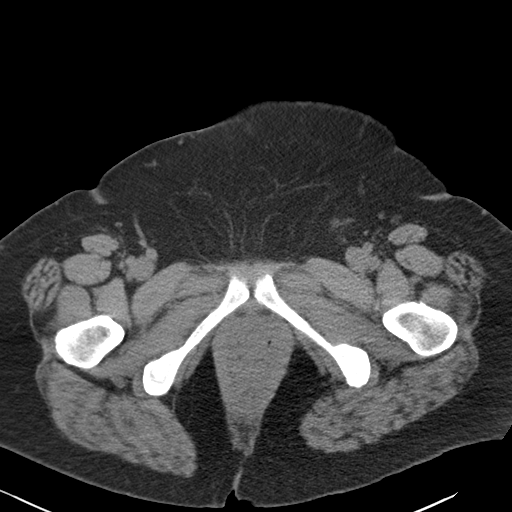
[im 5/91  bone]
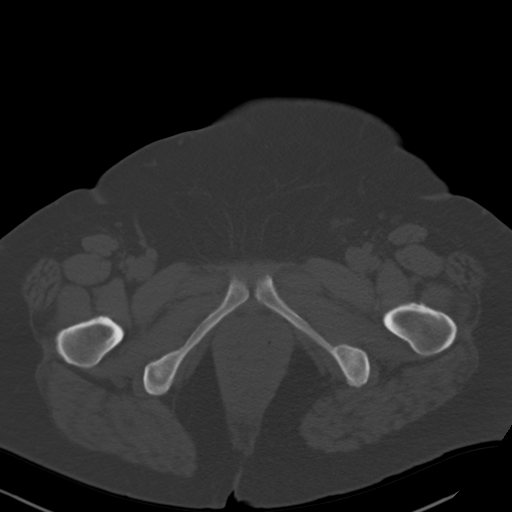
[im 15/91  soft-tissue]
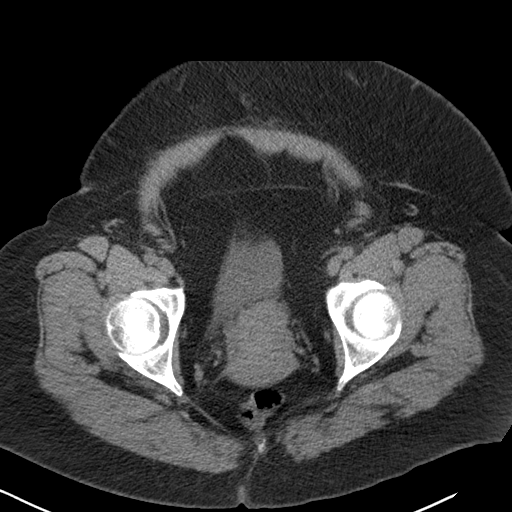
[im 19/91  soft-tissue]
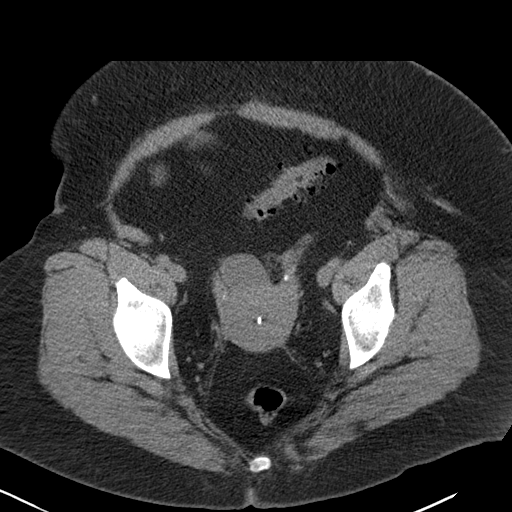
[im 24/91  soft-tissue]
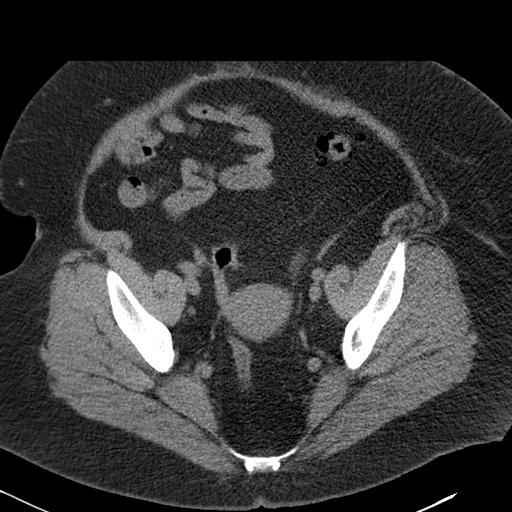
[im 34/91  soft-tissue]
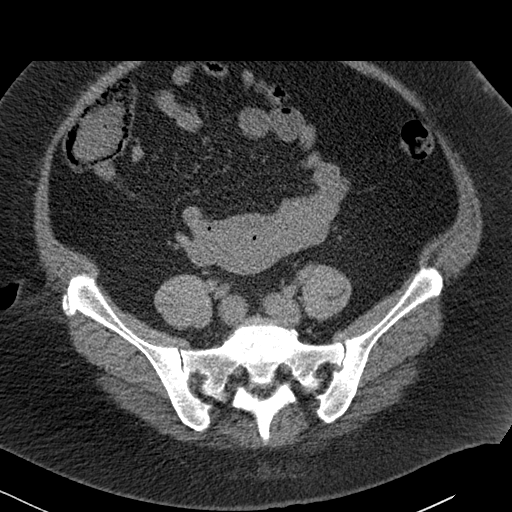
[im 38/91  soft-tissue]
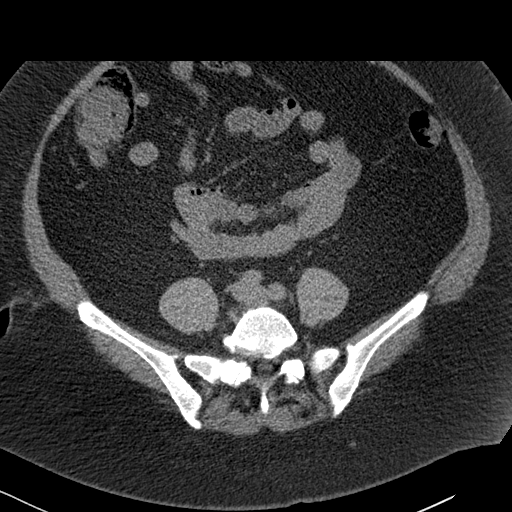
[im 48/91  soft-tissue]
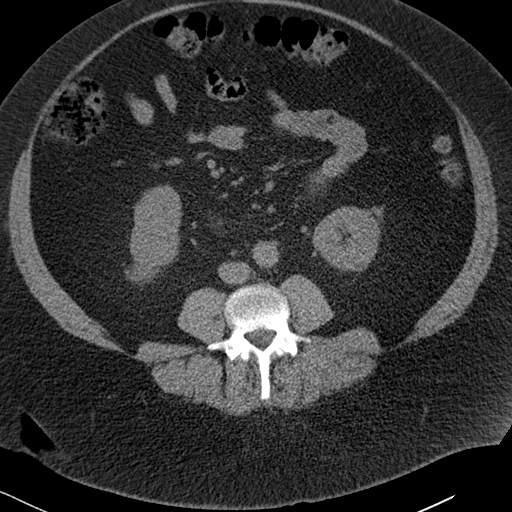
[im 53/91  soft-tissue]
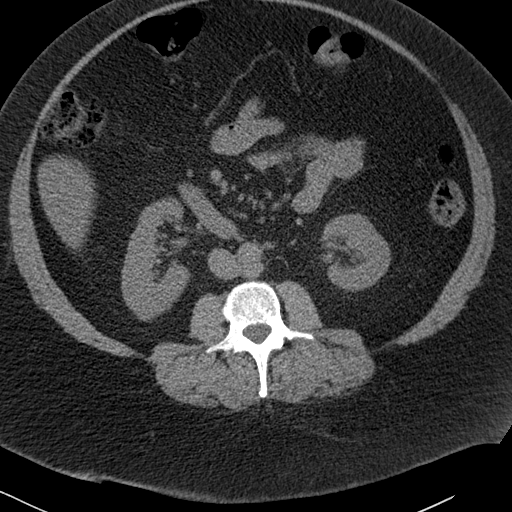
[im 57/91  soft-tissue]
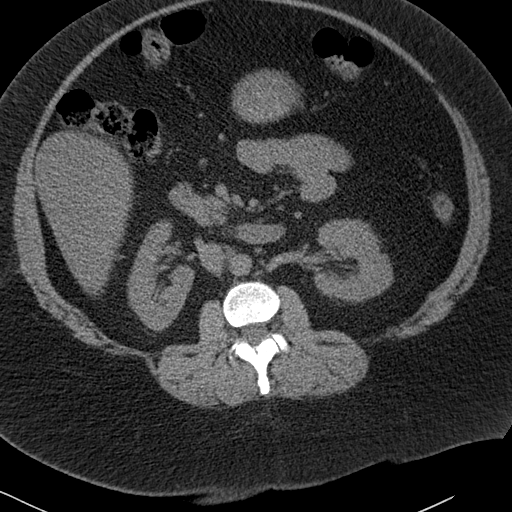
[im 57/91  bone]
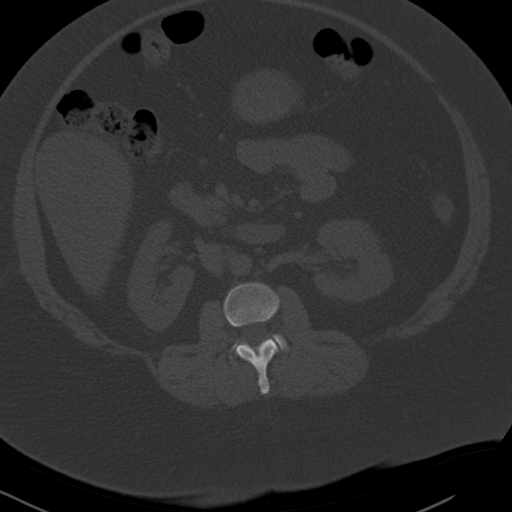
[im 67/91  soft-tissue]
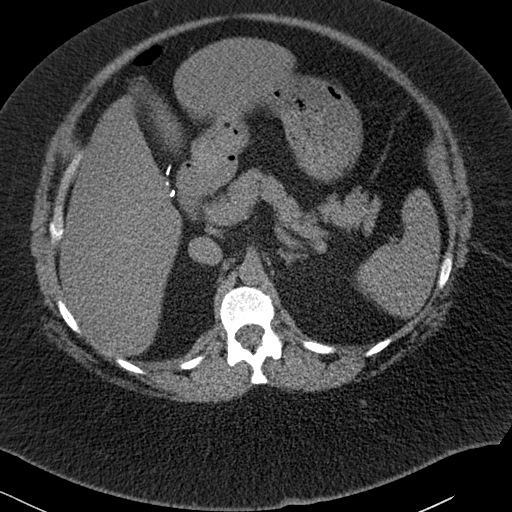
[im 72/91  soft-tissue]
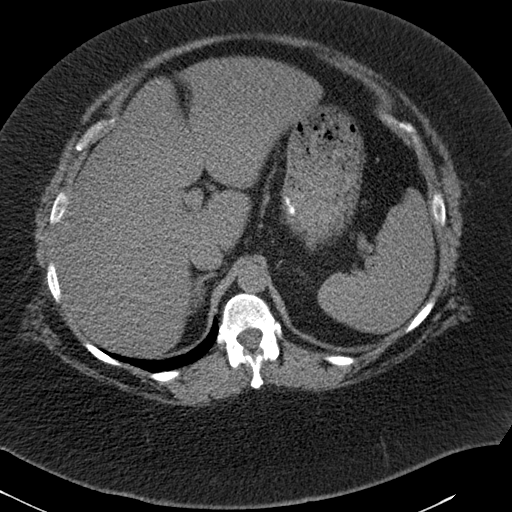
[im 76/91  soft-tissue]
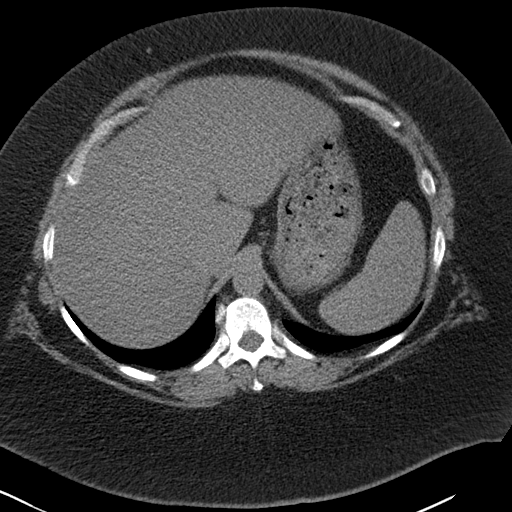
[im 86/91  soft-tissue]
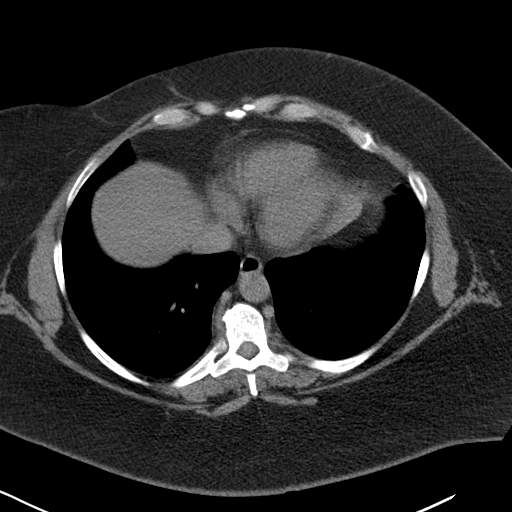

[Series 4: coronal · coronal · 0.76mm/px · 3 of 191 slices shown]
[im 64/191  soft-tissue]
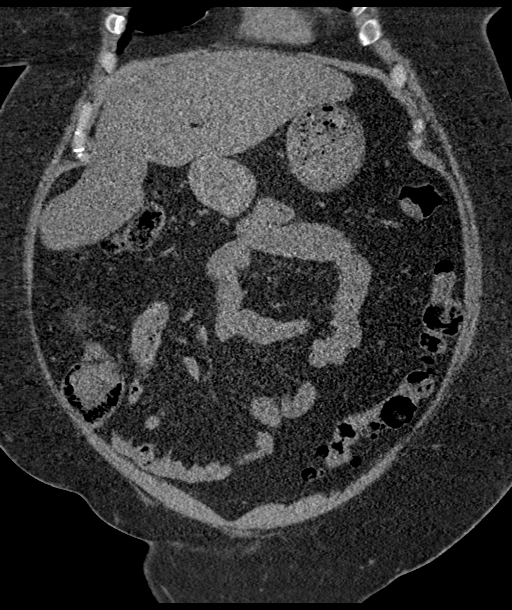
[im 85/191  soft-tissue]
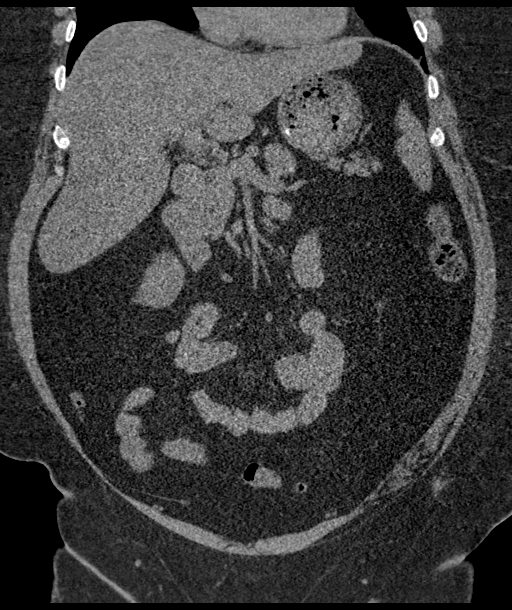
[im 106/191  soft-tissue]
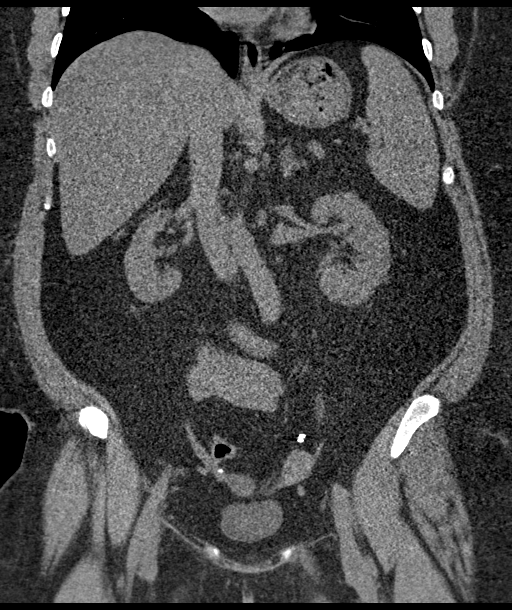

[16 of 46 positions shown; findings below may reference images not displayed]

FINDINGS: Lower chest: Negative.

Hepatobiliary: Absent gallbladder. Probable hepatic steatosis.
Otherwise negative noncontrast liver.

Pancreas: Negative.

Spleen: Negative.

Adrenals/Urinary Tract: Normal adrenal glands.

Mild congenital right renal malrotation, normal variant. 3-4 mm
right renal calculus seen on coronal image 90. No left
nephrolithiasis. Tiny exophytic left renal cysts suspected from the
lower pole.

No hydronephrosis and both ureters are decompressed and normal to
the urinary bladder. Diminutive and unremarkable bladder.

Stomach/Bowel: Negative rectum. Diverticulosis throughout the
sigmoid colon with no active inflammation. Negative upstream colon
aside from retained stool. Diminutive retrocecal appendix. Negative
terminal ileum.

No dilated small bowel. Unremarkable stomach and duodenum. No free
air, free fluid, mesenteric stranding.

Vascular/Lymphatic: Normal caliber aorta. Vascular patency is not
evaluated in the absence of IV contrast. No lymphadenopathy.

Reproductive: 2 tubal ligation clips are identified, although
neither appears to be attached to a fallopian tube (series 2, images
75 and 66). IUD also in place.

Other: No pelvic free fluid.

Musculoskeletal: No acute osseous abnormality identified.
IMPRESSION: 1. Right nephrolithiasis but no obstructive uropathy.

2. No acute or inflammatory process identified in the non-contrast
abdomen or pelvis.
Displaced tubal ligation clips, although superimposed IUD in place.
Sigmoid diverticulosis without active inflammation.
Probable hepatic steatosis.

## 2021-12-23 ENCOUNTER — Ambulatory Visit: Payer: BC Managed Care – PPO | Admitting: Podiatry

## 2021-12-23 ENCOUNTER — Encounter: Payer: Self-pay | Admitting: Podiatry

## 2021-12-23 DIAGNOSIS — L600 Ingrowing nail: Secondary | ICD-10-CM | POA: Diagnosis not present

## 2021-12-23 NOTE — Progress Notes (Signed)
  Subjective:  Patient ID: April Duncan, female    DOB: 1975/08/06,   MRN: [id number]  Chief Complaint  Patient presents with   Nail Problem     Right great toe ingrown    46 y.o. female presents for concern of right great toe ingrown nail that has been a chronic problem for her. Relates pain on the inside border and tenderness even with sheets on the toe. States it has worsened in the last 2 weeks. Relates she has had procedure on other side and is diabetic. Last A1c was around 5.   . Denies any other pedal complaints. Denies n/v/f/c.   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 CFT <3 seconds. Normal hair growth on digits. No edema.  Skin. No lacerations or abrasions bilateral feet. Incurvation of medial border of right great hallux. No erythema edema or purulence noted.  Musculoskeletal: MMT 5/5 bilateral lower extremities in DF, PF, Inversion and Eversion. Deceased ROM in DF of ankle joint.  Neurological: Sensation intact to light touch.   Assessment:  No diagnosis found.   Plan:  Patient was evaluated and treated and all questions answered. Patient requesting removal of ingrown nail today. Procedure below.  Discussed procedure and post procedure care and patient expressed understanding.  Will follow-up in 2 weeks for nail check or sooner if any problems arise.    Procedure:  Procedure: partial Nail Avulsion of right hallux medial nail border.  Surgeon: Lorenda Peck, DPM  Pre-op Dx: Ingrown toenail without infection Post-op: Same  Place of Surgery: Office exam room.  Indications for surgery: Painful and ingrown toenail.    The patient is requesting removal of nail with chemical matrixectomy. Risks and complications were discussed with the patient  for which they understand and written consent was obtained. Under sterile conditions a total of 3 mL of  1% lidocaine plain was infiltrated in a hallux block fashion. Once anesthetized, the skin was prepped in sterile fashion. A tourniquet was then applied. Next the medial aspect of hallux nail border was then sharply excised making sure to remove the entire offending nail border.  Next phenol was then applied under standard conditions and copiously irrigated. Silvadene was applied. A dry sterile dressing was applied. After application of the dressing the tourniquet was removed and there is found to be an immediate capillary refill time to the digit. The patient tolerated the procedure well without any complications. Post procedure instructions were discussed the patient for which he verbally understood. Follow-up in two weeks for nail check or sooner if any problems are to arise. Discussed signs/symptoms of infection and directed to call the office immediately should any occur or go directly to the emergency room. In the meantime, encouraged to call the office with any questions, concerns, changes symptoms.   Lorenda Peck, DPM

## 2021-12-23 NOTE — Patient Instructions (Signed)

## 2022-01-06 ENCOUNTER — Ambulatory Visit: Payer: BC Managed Care – PPO | Admitting: Podiatry

## 2022-01-27 ENCOUNTER — Encounter: Payer: Self-pay | Admitting: Podiatry

## 2022-01-27 ENCOUNTER — Ambulatory Visit (INDEPENDENT_AMBULATORY_CARE_PROVIDER_SITE_OTHER): Payer: BC Managed Care – PPO

## 2022-01-27 ENCOUNTER — Ambulatory Visit: Payer: BC Managed Care – PPO | Admitting: Podiatry

## 2022-01-27 DIAGNOSIS — M7661 Achilles tendinitis, right leg: Secondary | ICD-10-CM | POA: Diagnosis not present

## 2022-01-27 DIAGNOSIS — M79671 Pain in right foot: Secondary | ICD-10-CM

## 2022-01-27 DIAGNOSIS — L600 Ingrowing nail: Secondary | ICD-10-CM | POA: Diagnosis not present

## 2022-01-27 DIAGNOSIS — G8929 Other chronic pain: Secondary | ICD-10-CM | POA: Diagnosis not present

## 2022-01-27 NOTE — Progress Notes (Signed)
  Subjective:  Patient ID: April Duncan, female    DOB: 06/11/1976,   MRN: [id number]  Chief Complaint  Patient presents with   Nail Problem     2 week nail check    46 y.o. female presents for follow-up of right ingrown nail procedure. Realtes doing well with no pain. 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 Has tried lifts and stretching and anti-inflammatories without relief.  . Denies any other pedal complaints. Denies n/v/f/c.   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 CFT <3 seconds. Normal hair growth on digits. No edema.  Skin. No lacerations or abrasions bilateral feet.  Musculoskeletal: MMT 5/5 bilateral lower extremities in DF, PF, Inversion and Eversion. Deceased ROM in DF of ankle joint. Tender to insertion of achilles tendon. There is some edema noted in the area and spurring noted on palpation.  Neurological: Sensation intact to light touch.   Assessment:   1. Tendonitis, Achilles, right   2. Ingrown right greater toenail      Plan:  Patient was evaluated and treated and all questions answered. Toe was evaluated and appears to be healing well.  May discontinue soaks and neosporin.    -Xrays reviewed. No acute fractures or dislocations. There is significant spurring noted to posterior calcaneus.  -Discussed Achilles insertional tendonitis and treatment options with patient.  -Discussed stretching exercises. -Referral to PT to assist with stretching.  -Continue heel lifts.  MRI/PT/EPAT/PRP injections.  -Patient to return to office as needed or sooner if condition worsens.    Louann Sjogren, DPM

## 2022-02-01 ENCOUNTER — Ambulatory Visit: Payer: BC Managed Care – PPO | Attending: Podiatry | Admitting: Physical Therapy

## 2022-02-01 ENCOUNTER — Encounter: Payer: Self-pay | Admitting: Physical Therapy

## 2022-02-01 DIAGNOSIS — M6281 Muscle weakness (generalized): Secondary | ICD-10-CM | POA: Diagnosis not present

## 2022-02-01 DIAGNOSIS — M25571 Pain in right ankle and joints of right foot: Secondary | ICD-10-CM | POA: Diagnosis not present

## 2022-02-01 DIAGNOSIS — M7661 Achilles tendinitis, right leg: Secondary | ICD-10-CM | POA: Diagnosis not present

## 2022-02-01 NOTE — Therapy (Signed)
OUTPATIENT PHYSICAL THERAPY LOWER EXTREMITY EVALUATION   Patient Name: April Duncan MRN: [id number] DOB:[birth year], 46 y.o., female Today's Date: 02/01/2022   PT End of Session - 02/01/22 1146     Visit Number 1    Number of Visits 6    Date for PT Re-Evaluation 03/15/22    PT Start Time 1107    PT Stop Time 1146    PT Time Calculation (min) 39 min    Activity Tolerance Patient tolerated treatment well    Behavior During Therapy [facility] for tasks assessed/performed             Past Medical History:  Diagnosis Date   Allergy    Anemia    Asthma    Diabetes mellitus    Hypertension    PCOS (polycystic ovarian syndrome)    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   Past Surgical History:  Procedure Laterality Date   CHOLECYSTECTOMY     GALLBLADDER SURGERY     HERNIA REPAIR     TUBAL LIGATION     Patient Active Problem List   Diagnosis Date Noted   Tendonitis, Achilles, left 10/21/2014   Morbid obesity with BMI of 50.0-59.9, adult (HCC) 03/12/2013   Diabetes mellitus 08/19/2011   Hypertension 08/19/2011   PCOS (polycystic ovarian syndrome) 08/19/2011   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 She states she has been to multiple MDs and tried braces and compression sleeves and nothing has helped. Pain increases with rising from kneeling or with prolonged standing. Pain decreases with rest and ice.  PERTINENT HISTORY: Weight loss surgery 1 year ago  PAIN:  Are you having pain? No  PRECAUTIONS: None  WEIGHT BEARING RESTRICTIONS No  FALLS:  Has patient fallen in last 6 months?  No  LIVING ENVIRONMENT:  Stairs: Yes: Internal: 12 steps; on right going up Has following equipment at home: None  OCCUPATION: teacher  PLOF: Independent  PATIENT GOALS return to martial arts and coaching track without pain   OBJECTIVE:     PATIENT SURVEYS:  Not indicated  COGNITION:  Overall cognitive status: Within functional limits for tasks assessed     SENSATION: WFL  EDEMA:  Slight edema near Rt achilles insertion    PALPATION: Noticeable bone spur Rt achilles, TTP around bone spur  LOWER EXTREMITY ROM:  Active ROM Right eval Left eval  Hip flexion    Hip extension    Hip abduction    Hip adduction    Hip internal rotation    Hip external rotation    Knee flexion    Knee extension    Ankle dorsiflexion 10 10  Ankle plantarflexion 35-pain 50  Ankle inversion 20   Ankle eversion 33    (Blank rows = not tested)  LOWER EXTREMITY MMT:  MMT Right eval Left eval  Hip flexion    Hip extension    Hip abduction    Hip adduction    Hip internal rotation    Hip external rotation    Knee flexion    Knee extension    Ankle dorsiflexion 4-/5   Ankle plantarflexion 4/5  Ankle inversion 3+/5   Ankle eversion 3+/5    (Blank rows = not tested)    FUNCTIONAL TESTS:  SL stance Rt > 20 sec    TODAY'S TREATMENT: 7/26  Ankle 4 way red TB x 10 Gastroc stretch x 30 sec Soleus stretch x 30 sec  Iontophoresis 4 hour patch applied to Rt achilles insertion   PATIENT EDUCATION:  Education details: PT POC and goals, HEP, ionto Person educated: Patient Education method: Programmer, multimedia, Facilities manager, and Handouts Education comprehension: verbalized understanding and returned demonstration   HOME EXERCISE PROGRAM: Access Code: [email or web address]   Exercises - Ankle Inversion with Resistance  - 1 x daily - 7 x weekly - 2 sets - 10 reps - Ankle Eversion with Resistance  - 1 x daily - 7 x weekly - 2 sets - 10 reps - Ankle Dorsiflexion with Resistance  - 1 x  daily - 7 x weekly - 3 sets - 10 reps - Ankle and Toe Plantarflexion with Resistance  - 1 x daily - 7 x weekly - 3 sets - 10 reps - Gastroc Stretch on Wall  - 1 x daily - 7 x weekly - 1 sets - 3 reps - 20-30 sec hold - Soleus Stretch on Wall  - 1 x daily - 7 x weekly - 1 sets - 3 reps - 20-30 sec hold  Patient Education - [place] Patient Instructions  ASSESSMENT:  CLINICAL IMPRESSION: Patient is a 46 y.o. female who was seen today for physical therapy evaluation and treatment for Rt achilles tendonitis. Pt presents with increased pain, decreased strength and ROM and decreased activity tolerance. Pt will benefit from skilled PT to address deficits and improve functional mobility.    OBJECTIVE IMPAIRMENTS decreased activity tolerance, decreased ROM, decreased strength, increased edema, and pain.   ACTIVITY LIMITATIONS standing, squatting, and stairs  PARTICIPATION LIMITATIONS: community activity  PERSONAL FACTORS Time since onset of injury/illness/exacerbation are also affecting patient's functional outcome.   REHAB POTENTIAL: Good  CLINICAL DECISION MAKING: Evolving/moderate complexity  EVALUATION COMPLEXITY: Moderate   GOALS: Goals reviewed with patient? Yes   LONG TERM GOALS: Target date: 03/15/2022   Pt will be independent with HEP Baseline:  Goal status: INITIAL  2.  Pt will improve Rt ankle strength to 4+/5 to improve standing tolerance Baseline:  Goal status: INITIAL  3.  Pt will tolerate descending stairs with pain <= 1/10 in Rt ankle Baseline:  Goal status: INITIAL  4.  Pt will return to full participation martial arts with pain <= 1/10 Baseline:  Goal status: INITIAL    PLAN: PT FREQUENCY: 1x/week  PT DURATION: 6 weeks  PLANNED INTERVENTIONS: Therapeutic exercises, Therapeutic activity, Neuromuscular re-education, Balance training, Gait training, Patient/Family education, Self Care, Joint mobilization, Aquatic Therapy, Dry Needling, Electrical  stimulation, Cryotherapy, Moist heat, Taping, Vasopneumatic device, and Manual therapy  PLAN FOR NEXT SESSION: assess and progress HEP, ankle ROM and strength, stairs/squatting   [employer]on [employer], PT 02/01/2022, 1:18 PM

## 2022-02-15 ENCOUNTER — Ambulatory Visit: Payer: BC Managed Care – PPO | Attending: Podiatry | Admitting: Physical Therapy

## 2022-02-15 DIAGNOSIS — M7661 Achilles tendinitis, right leg: Secondary | ICD-10-CM | POA: Insufficient documentation

## 2022-02-15 DIAGNOSIS — M25571 Pain in right ankle and joints of right foot: Secondary | ICD-10-CM | POA: Insufficient documentation

## 2022-02-15 DIAGNOSIS — M6281 Muscle weakness (generalized): Secondary | ICD-10-CM | POA: Insufficient documentation

## 2023-10-31 ENCOUNTER — Encounter: Payer: Self-pay | Admitting: Podiatry

## 2023-10-31 ENCOUNTER — Ambulatory Visit: Admitting: Podiatry

## 2023-10-31 DIAGNOSIS — L600 Ingrowing nail: Secondary | ICD-10-CM | POA: Diagnosis not present

## 2023-10-31 DIAGNOSIS — L603 Nail dystrophy: Secondary | ICD-10-CM

## 2023-10-31 NOTE — Progress Notes (Signed)
  Subjective:  Patient ID: April Duncan, female    DOB: 1976/01/19,   MRN: [id number]  No chief complaint on file.   48 y.o. female presents for new concern of right great toenail lifting up relates this has started happening several weeks ago. Did relate bleeding and pain. Has been using neosporin and that has helped with the pain . Denies any other pedal complaints. Denies n/v/f/c.   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 CFT <3 seconds. Normal hair growth on digits. No edema.  Skin. No lacerations or abrasions bilateral feet. Right hallux nail partially lifted from distal nail plate with subungual debris. No erythema edema or purulence noted.  Musculoskeletal: MMT 5/5 bilateral lower extremities in DF, PF, Inversion and Eversion. Deceased ROM in DF of ankle joint.  Neurological: Sensation intact to light touch.   Assessment:   1. Onychodystrophy   2. Ingrown right greater toenail      Plan:  Patient was evaluated and treated and all questions answered. Discussed ingrown toenails etiology and treatment options including procedure for removal vs conservative care.  Patient requesting removal of ingrown nail today. Procedure below.  Discussed procedure and post procedure care and patient expressed understanding.  Will follow-up in 2 weeks for nail check or sooner if any problems arise.    Procedure:  Procedure: partial Nail Avulsion of right hallux nail Surgeon: Jennefer Moats, DPM  Pre-op Dx: Ingrown toenail without infection Post-op: Same  Place of Surgery: Office exam room.  Indications for surgery: Painful and ingrown toenail.    The patient is requesting removal of nail without  chemical matrixectomy. Risks and complications were discussed with  the patient for which they understand and written consent was obtained. Under sterile conditions a total of 3 mL of  1% lidocaine  plain was infiltrated in a hallux block fashion. Once anesthetized, the skin was prepped in sterile fashion. A tourniquet was then applied. Next the entire hallux nail was removed copiously irrigated. Silvadene  was applied. A dry sterile dressing was applied. After application of the dressing the tourniquet was removed and there is found to be an immediate capillary refill time to the digit. The patient tolerated the procedure well without any complications. Post procedure instructions were discussed the patient for which he verbally understood. Follow-up in two weeks for nail check or sooner if any problems are to arise. Discussed signs/symptoms of infection and directed to call the office immediately should any occur or go directly to the emergency room. In the meantime, encouraged to call the office with any questions, concerns, changes symptoms.   Jennefer Moats, DPM

## 2023-10-31 NOTE — Patient Instructions (Signed)

## 2023-11-16 ENCOUNTER — Ambulatory Visit (INDEPENDENT_AMBULATORY_CARE_PROVIDER_SITE_OTHER): Admitting: Podiatry

## 2023-11-16 DIAGNOSIS — Z91199 Patient's noncompliance with other medical treatment and regimen due to unspecified reason: Secondary | ICD-10-CM

## 2023-11-16 NOTE — Progress Notes (Signed)
 No show
